# Patient Record
Sex: Female | Born: 1990 | Race: White | Hispanic: No | Marital: Married | Smoking: Never smoker
Health system: Southern US, Community
[De-identification: ages and names within clinical notes are randomized; demographics above are authoritative.]

## PROBLEM LIST (undated history)

## (undated) DIAGNOSIS — R519 Headache, unspecified: Secondary | ICD-10-CM

## (undated) DIAGNOSIS — N83209 Unspecified ovarian cyst, unspecified side: Secondary | ICD-10-CM

## (undated) DIAGNOSIS — A609 Anogenital herpesviral infection, unspecified: Secondary | ICD-10-CM

## (undated) DIAGNOSIS — K219 Gastro-esophageal reflux disease without esophagitis: Secondary | ICD-10-CM

## (undated) DIAGNOSIS — T7840XA Allergy, unspecified, initial encounter: Secondary | ICD-10-CM

## (undated) HISTORY — DX: Anogenital herpesviral infection, unspecified: A60.9

## (undated) HISTORY — DX: Unspecified ovarian cyst, unspecified side: N83.209

## (undated) HISTORY — DX: Allergy, unspecified, initial encounter: T78.40XA

## (undated) HISTORY — PX: WISDOM TOOTH EXTRACTION: SHX21

---

## 2012-02-07 ENCOUNTER — Ambulatory Visit (INDEPENDENT_AMBULATORY_CARE_PROVIDER_SITE_OTHER): Payer: Managed Care, Other (non HMO) | Admitting: Internal Medicine

## 2012-02-07 VITALS — BP 92/52 | HR 86 | Temp 98.6°F | Resp 14 | Ht 65.0 in | Wt 112.6 lb

## 2012-02-07 DIAGNOSIS — L738 Other specified follicular disorders: Secondary | ICD-10-CM

## 2012-02-07 DIAGNOSIS — L739 Follicular disorder, unspecified: Secondary | ICD-10-CM

## 2012-02-07 MED ORDER — CEPHALEXIN 250 MG PO CAPS: 250.0000 mg | ORAL_CAPSULE | Freq: Three times a day (TID) | ORAL | Status: AC

## 2012-02-07 NOTE — Progress Notes (Signed)
  Subjective:    Patient ID: Sandra Wilcox, female    DOB: [birth year], 21 y.o.   MRN: [id number]  HPI Mass? L axillae 1 day tender   Healthy no meds  [facility] studies   Review of Systems     Objective:   Physical Exam inflammed follicle L axillae       Assessment & Plan:  Problem #1 folliculitis Keflex 250 3 times a day #15 Hot soaks 3 times a day

## 2012-10-11 ENCOUNTER — Ambulatory Visit: Payer: 59 | Admitting: Emergency Medicine

## 2012-10-11 VITALS — BP 124/76 | HR 97 | Temp 98.1°F | Resp 16 | Ht 65.0 in | Wt 121.4 lb

## 2012-10-11 DIAGNOSIS — J309 Allergic rhinitis, unspecified: Secondary | ICD-10-CM

## 2012-10-11 MED ORDER — FLUTICASONE PROPIONATE 50 MCG/ACT NA SUSP: 2.0000 | Freq: Every day | NASAL | Status: DC

## 2012-10-11 NOTE — Progress Notes (Signed)
  Subjective:    Patient ID: Sandra Wilcox, female    DOB: 1990/10/25, 22 y.o.   MRN: [id number]  HPI  Patient comes into our office today with sinus problems for 2 weeks.  She also states that she was at the pool two days ago and her left ears feels clogged up    Review of Systems  HENT: Positive for sneezing and sinus pressure.   Eyes: Positive for discharge and itching.  Respiratory: Positive for cough.   Neurological: Positive for headaches.       Objective:   Physical Exam there is wax occluding the left external auditory canal appear the right external auditory canal is normal the nose is congested throat is normal neck is supple chest is clear.        Assessment & Plan:    Patient is allergic rhinitis. We'll treat with Zyrtec. Eyedrops, and Flonase.

## 2012-10-11 NOTE — Progress Notes (Deleted)
  Subjective:    Patient ID: Sandra Wilcox, female    DOB: 10-Jun-1990, 22 y.o.   MRN: [id number]  HPI    Review of Systems     Objective:   Physical Exam        Assessment & Plan:

## 2012-10-11 NOTE — Patient Instructions (Signed)
      Allergic Rhinitis Allergic rhinitis is when the mucous membranes in the nose respond to allergens. Allergens are particles in the air that cause your body to have an allergic reaction. This causes you to release allergic antibodies. Through a chain of events, these eventually cause you to release histamine into the blood stream (hence the use of antihistamines). Although meant to be protective to the body, it is this release that causes your discomfort, such as frequent sneezing, congestion and an itchy runny nose.  CAUSES  The pollen allergens may come from grasses, trees, and weeds. This is seasonal allergic rhinitis, or "hay fever." Other allergens cause year-round allergic rhinitis (perennial allergic rhinitis) such as house dust mite allergen, pet dander and mold spores.  SYMPTOMS   Nasal stuffiness (congestion).  Runny, itchy nose with sneezing and tearing of the eyes.  There is often an itching of the mouth, eyes and ears. It cannot be cured, but it can be controlled with medications. DIAGNOSIS  If you are unable to determine the offending allergen, skin or blood testing may find it. TREATMENT   Avoid the allergen.  Medications and allergy shots (immunotherapy) can help.  Hay fever may often be treated with antihistamines in pill or nasal spray forms. Antihistamines block the effects of histamine. There are over-the-counter medicines that may help with nasal congestion and swelling around the eyes. Check with your caregiver before taking or giving this medicine. If the treatment above does not work, there are many new medications your caregiver can prescribe. Stronger medications may be used if initial measures are ineffective. Desensitizing injections can be used if medications and avoidance fails. Desensitization is when a patient is given ongoing shots until the body becomes less sensitive to the allergen. Make sure you follow up with your caregiver if problems  continue. SEEK MEDICAL CARE IF:   You develop fever (more than 100.5 F (38.1 C).  You develop a cough that does not stop easily (persistent).  You have shortness of breath.  You start wheezing.  Symptoms interfere with normal daily activities. Document Released: 02/15/2001 Document Revised: 08/15/2011 Document Reviewed: 08/27/2008 [facility] Patient Information 2013 [place], [place]. Please take Zyrtec 10 mg one at night. He uses Naphcon-A eyedrops and Flonase you were given

## 2012-11-28 ENCOUNTER — Ambulatory Visit: Payer: 59 | Admitting: Physician Assistant

## 2012-11-28 VITALS — BP 111/70 | HR 76 | Temp 98.3°F | Resp 16 | Ht 65.0 in | Wt 120.6 lb

## 2012-11-28 DIAGNOSIS — H6692 Otitis media, unspecified, left ear: Secondary | ICD-10-CM

## 2012-11-28 DIAGNOSIS — R05 Cough: Secondary | ICD-10-CM

## 2012-11-28 DIAGNOSIS — H6690 Otitis media, unspecified, unspecified ear: Secondary | ICD-10-CM

## 2012-11-28 DIAGNOSIS — R059 Cough, unspecified: Secondary | ICD-10-CM

## 2012-11-28 MED ORDER — HYDROCODONE-HOMATROPINE 5-1.5 MG/5ML PO SYRP: 5.0000 mL | ORAL_SOLUTION | Freq: Three times a day (TID) | ORAL | Status: DC | PRN

## 2012-11-28 MED ORDER — AMOXICILLIN 500 MG PO CAPS: 1000.0000 mg | ORAL_CAPSULE | Freq: Three times a day (TID) | ORAL | Status: DC

## 2012-11-28 NOTE — Progress Notes (Signed)
  Subjective:    Patient ID: Sandra Wilcox, female    DOB: Oct 08, 1990, 22 y.o.   MRN: [id number]  HPI 22 year old female presents with 5 day history of cough, PND, and sore throat.  Symptoms started 5 days ago and have progressively worsened.  Has had intermittent chills but no documented fever or body aches. Does complain that since yesterday her left ear has become painful.  Denies nausea, vomiting, dizziness, wheezing, SOB, or hemoptysis.      Review of Systems  Constitutional: Positive for chills. Negative for fever.  HENT: Positive for ear pain (left), congestion, sore throat, rhinorrhea and postnasal drip.   Respiratory: Positive for cough. Negative for shortness of breath and wheezing.   Gastrointestinal: Negative for nausea and vomiting.  Neurological: Negative for dizziness and headaches.       Objective:   Physical Exam  Constitutional: She is oriented to person, place, and time. She appears well-developed and well-nourished.  HENT:  Head: Normocephalic and atraumatic.  Right Ear: Hearing, tympanic membrane, external ear and ear canal normal.  Left Ear: Hearing, external ear and ear canal normal. Tympanic membrane is erythematous. Tympanic membrane is not perforated. A middle ear effusion is present.  Nose: Right sinus exhibits no maxillary sinus tenderness and no frontal sinus tenderness. Left sinus exhibits no maxillary sinus tenderness and no frontal sinus tenderness.  Mouth/Throat: Uvula is midline, oropharynx is clear and moist and mucous membranes are normal. No oropharyngeal exudate.  Eyes: Conjunctivae are normal.  Neck: Normal range of motion. Neck supple.  Cardiovascular: Normal rate, regular rhythm and normal heart sounds.   Pulmonary/Chest: Effort normal and breath sounds normal.  Lymphadenopathy:    She has no cervical adenopathy.  Neurological: She is alert and oriented to person, place, and time.  Psychiatric: She has a normal mood and affect. Her behavior is  normal. Judgment and thought content normal.          Assessment & Plan:  Otitis media, left - Plan: amoxicillin (AMOXIL) 500 MG capsule  Cough - Plan: HYDROcodone-homatropine (HYCODAN) 5-1.5 MG/5ML syrup  Start amoxicillin 1000 mg tid x 10 days Hycodan qhs prn cough - caution sedation Increase fluids and rest.  Follow up if symptoms worsen or fail to improve.

## 2014-02-03 ENCOUNTER — Emergency Department (INDEPENDENT_AMBULATORY_CARE_PROVIDER_SITE_OTHER)
Admission: EM | Admit: 2014-02-03 | Discharge: 2014-02-03 | Disposition: A | Discharge status: Home / Self Care | Payer: 59 | Source: Home / Self Care

## 2014-02-03 ENCOUNTER — Encounter (HOSPITAL_COMMUNITY): Payer: Self-pay | Admitting: Emergency Medicine

## 2014-02-03 DIAGNOSIS — R05 Cough: Secondary | ICD-10-CM

## 2014-02-03 DIAGNOSIS — R059 Cough, unspecified: Secondary | ICD-10-CM

## 2014-02-03 DIAGNOSIS — M545 Low back pain, unspecified: Secondary | ICD-10-CM

## 2014-02-03 DIAGNOSIS — B373 Candidiasis of vulva and vagina: Secondary | ICD-10-CM

## 2014-02-03 DIAGNOSIS — Z9109 Other allergy status, other than to drugs and biological substances: Secondary | ICD-10-CM

## 2014-02-03 DIAGNOSIS — B3731 Acute candidiasis of vulva and vagina: Secondary | ICD-10-CM

## 2014-02-03 LAB — POCT PREGNANCY, URINE: Preg Test, Ur: NEGATIVE

## 2014-02-03 NOTE — ED Notes (Signed)
C/o L lower back pain onset this AM.  Has been on antibiotics Augmentin for a sinus infection for 1 week and it gave her a yeast infection.  Still have a cough- has had for 1 month.

## 2014-02-03 NOTE — Discharge Instructions (Signed)
Allergies  Allergies may happen from anything your body is sensitive to. This may be food, medicines, pollens, chemicals, and many other things. Food allergies can be severe and deadly.  HOME CARE  If you do not know what causes a reaction, keep a diary. Write down the foods you ate and the symptoms that followed. Avoid foods that cause reactions.  If you have red raised spots (hives) or a rash:  Take medicine as told by your doctor.  Use medicines for red raised spots and itching as needed.  Apply cold cloths (compresses) to the skin. Take a cool bath. Avoid hot baths or showers.  If you are severely allergic:  It is often necessary to go to the hospital after you have treated your reaction.  Wear your medical alert jewelry.  You and your family must learn how to give a allergy shot or use an allergy kit (anaphylaxis kit).  Always carry your allergy kit or shot with you. Use this medicine as told by your doctor if a severe reaction is occurring. GET HELP RIGHT AWAY IF:  You have trouble breathing or are making high-pitched whistling sounds (wheezing).  You have a tight feeling in your chest or throat.  You have a puffy (swollen) mouth.  You have red raised spots, puffiness (swelling), or itching all over your body.  You have had a severe reaction that was helped by your allergy kit or shot. The reaction can return once the medicine has worn off.  You think you are having a food allergy. Symptoms most often happen within 30 minutes of eating a food.  Your symptoms have not gone away within 2 days or are getting worse.  You have new symptoms.  You want to retest yourself with a food or drink you think causes an allergic reaction. Only do this under the care of a doctor. MAKE SURE YOU:   Understand these instructions.  Will watch your condition.  Will get help right away if you are not doing well or get worse. Document Released: 09/17/2012 Document Reviewed:  09/17/2012 [facility] Patient Information 2015 [place]. This information is not intended to replace advice given to you by your health care provider. Make sure you discuss any questions you have with your health care provider.  Back Pain, Adult Back pain is very common. The pain often gets better over time. The cause of back pain is usually not dangerous. Most people can learn to manage their back pain on their own.  HOME CARE   Stay active. Start with short walks on flat ground if you can. Try to walk farther each day.  Do not sit, drive, or stand in one place for more than 30 minutes. Do not stay in bed.  Do not avoid exercise or work. Activity can help your back heal faster.  Be careful when you bend or lift an object. Bend at your knees, keep the object close to you, and do not twist.  Sleep on a firm mattress. Lie on your side, and bend your knees. If you lie on your back, put a pillow under your knees.  Only take medicines as told by your doctor.  Put ice on the injured area.  Put ice in a plastic bag.  Place a towel between your skin and the bag.  Leave the ice on for 15-20 minutes, 03-04 times a day for the first 2 to 3 days. After that, you can switch between ice and heat packs.  Ask your  doctor about back exercises or massage.  Avoid feeling anxious or stressed. Find good ways to deal with stress, such as exercise. GET HELP RIGHT AWAY IF:   Your pain does not go away with rest or medicine.  Your pain does not go away in 1 week.  You have new problems.  You do not feel well.  The pain spreads into your legs.  You cannot control when you poop (bowel movement) or pee (urinate).  Your arms or legs feel weak or lose feeling (numbness).  You feel sick to your stomach (nauseous) or throw up (vomit).  You have belly (abdominal) pain.  You feel like you may pass out (faint). MAKE SURE YOU:   Understand these instructions.  Will watch your  condition.  Will get help right away if you are not doing well or get worse. Document Released: 11/09/2007 Document Revised: 08/15/2011 Document Reviewed: 09/24/2013 [facility] Patient Information 2015 [place], [place]. This information is not intended to replace advice given to you by your health care provider. Make sure you discuss any questions you have with your health care provider.  Candida Infection A Candida infection (also called yeast, fungus, and Monilia infection) is an overgrowth of yeast that can occur anywhere on the body. A yeast infection commonly occurs in warm, moist body areas. Usually, the infection remains localized but can spread to become a systemic infection. A yeast infection may be a sign of a more severe disease such as diabetes, leukemia, or AIDS. A yeast infection can occur in both men and women. In women, Candida vaginitis is a vaginal infection. It is one of the most common causes of vaginitis. Men usually do not have symptoms or know they have an infection until other problems develop. Men may find out they have a yeast infection because their sex partner has a yeast infection. Uncircumcised men are more likely to get a yeast infection than circumcised men. This is because the uncircumcised glans is not exposed to air and does not remain as dry as that of a circumcised glans. Older adults may develop yeast infections around dentures. CAUSES  Women  Antibiotics.  Steroid medication taken for a long time.  Being overweight (obese).  Diabetes.  Poor immune condition.  Certain serious medical conditions.  Immune suppressive medications for organ transplant patients.  Chemotherapy.  Pregnancy.  Menstruation.  Stress and fatigue.  Intravenous drug use.  Oral contraceptives.  Wearing tight-fitting clothes in the crotch area.  Catching it from a sex partner who has a yeast infection.  Spermicide.  Intravenous, urinary, or other  catheters. Men  Catching it from a sex partner who has a yeast infection.  Having oral or anal sex with a person who has the infection.  Spermicide.  Diabetes.  Antibiotics.  Poor immune system.  Medications that suppress the immune system.  Intravenous drug use.  Intravenous, urinary, or other catheters. SYMPTOMS  Women  Thick, white vaginal discharge.  Vaginal itching.  Redness and swelling in and around the vagina.  Irritation of the lips of the vagina and perineum.  Blisters on the vaginal lips and perineum.  Painful sexual intercourse.  Low blood sugar (hypoglycemia).  Painful urination.  Bladder infections.  Intestinal problems such as constipation, indigestion, bad breath, bloating, increase in gas, diarrhea, or loose stools. Men  Men may develop intestinal problems such as constipation, indigestion, bad breath, bloating, increase in gas, diarrhea, or loose stools.  Dry, cracked skin on the penis with itching or discomfort.  Jock  itch.  Dry, flaky skin.  Athlete's foot.  Hypoglycemia. DIAGNOSIS  Women  A history and an exam are performed.  The discharge may be examined under a microscope.  A culture may be taken of the discharge. Men  A history and an exam are performed.  Any discharge from the penis or areas of cracked skin will be looked at under the microscope and cultured.  Stool samples may be cultured. TREATMENT  Women  Vaginal antifungal suppositories and creams.  Medicated creams to decrease irritation and itching on the outside of the vagina.  Warm compresses to the perineal area to decrease swelling and discomfort.  Oral antifungal medications.  Medicated vaginal suppositories or cream for repeated or recurrent infections.  Wash and dry the irritation areas before applying the cream.  Eating yogurt with Lactobacillus may help with prevention and treatment.  Sometimes painting the vagina with gentian violet  solution may help if creams and suppositories do not work. Men  Antifungal creams and oral antifungal medications.  Sometimes treatment must continue for 30 days after the symptoms go away to prevent recurrence. HOME CARE INSTRUCTIONS  Women  Use cotton underwear and avoid tight-fitting clothing.  Avoid colored, scented toilet paper and deodorant tampons or pads.  Do not douche.  Keep your diabetes under control.  Finish all the prescribed medications.  Keep your skin clean and dry.  Consume milk or yogurt with Lactobacillus-active culture regularly. If you get frequent yeast infections and think that is what the infection is, there are over-the-counter medications that you can get. If the infection does not show healing in 3 days, talk to your caregiver.  Tell your sex partner you have a yeast infection. Your partner may need treatment also, especially if your infection does not clear up or recurs. Men  Keep your skin clean and dry.  Keep your diabetes under control.  Finish all prescribed medications.  Tell your sex partner that you have a yeast infection so he or she can be treated if necessary. SEEK MEDICAL CARE IF:   Your symptoms do not clear up or worsen in one week after treatment.  You have an oral temperature above 102 F (38.9 C).  You have trouble swallowing or eating for a prolonged time.  You develop blisters on and around your vagina.  You develop vaginal bleeding and it is not your menstrual period.  You develop abdominal pain.  You develop intestinal problems as mentioned above.  You get weak or light-headed.  You have painful or increased urination.  You have pain during sexual intercourse. MAKE SURE YOU:   Understand these instructions.  Will watch your condition.  Will get help right away if you are not doing well or get worse. Document Released: 06/30/2004 Document Revised: 10/07/2013 Document Reviewed: 10/12/2009 [facility] Patient  Information 2015 [place], [place]. This information is not intended to replace advice given to you by your health care provider. Make sure you discuss any questions you have with your health care provider.   Yeast infection following an antibiotic can happen-Ok to complete course of Monistat that you had over the counter.  Low Back sprain-Aleve 2 in am and 2 in pm for 5-7 days.  Cough secondary to seasonal allergies-Start daily Zyrtec or Claritin with use of Mucinex as needed for cough. No signs of infection

## 2014-02-03 NOTE — ED Provider Notes (Signed)
CSN: [id number]     Arrival date & time 02/03/14  1918 History   None    Chief Complaint  Patient presents with  . Back Pain   (Consider location/radiation/quality/duration/timing/severity/associated sxs/prior Treatment) HPI Comments: Patient presents with several co's. She completed a course of Augmentin 2 days ago and now has itching, white discharge following antibiotic use. This has happened in the past. No urinary burning, hematuria or abdominal pain.  Her cough has continued despite Augmentin. No fever or chills. No malaise. She notes post nasal discharge and nasal congestion. Cough has caused pain at times in her back, and today onset of right lower back pain. Pain with ROM and with coughing. No radicular pain. No weakness.   Patient is a 23 y.o. female presenting with back pain. The history is provided by the patient.  Back Pain   Past Medical History  Diagnosis Date  . Allergy    History reviewed. No pertinent past surgical history. Family History  Problem Relation Age of Onset  . Cancer Mother     skin- melanoma  . Cancer Maternal Grandfather     skin   History  Substance Use Topics  . Smoking status: Never Smoker   . Smokeless tobacco: Not on file  . Alcohol Use: 3.6 oz/week    6 Shots of liquor per week   OB History   Grav Para Term Preterm Abortions TAB SAB Ect Mult Living                 Review of Systems  Musculoskeletal: Positive for back pain.  All other systems reviewed and are negative.   Allergies  Review of patient's allergies indicates no known allergies.  Home Medications   Prior to Admission medications   Medication Sig Start Date End Date Taking? Authorizing Provider  amoxicillin-clavulanate (AUGMENTIN) 875-125 MG per tablet Take 1 tablet by mouth 2 (two) times daily.   Yes Historical Provider, MD  Cranberry-Vitamin C-Probiotic (AZO CRANBERRY PO) Take 1 tablet by mouth 3 (three) times daily.   Yes Historical Provider, MD  Homeopathic  Products (AZO YEAST PLUS) TABS Take 1 tablet by mouth.   Yes Historical Provider, MD  amoxicillin (AMOXIL) 500 MG capsule Take 2 capsules (1,000 mg total) by mouth 3 (three) times daily. 11/28/12   Heather Jaquita Rector, PA-C  fluticasone (FLONASE) 50 MCG/ACT nasal spray Place 2 sprays into the nose daily. 10/11/12   Collene Gobble, MD  HYDROcodone-homatropine [facility]) 5-1.5 MG/5ML syrup Take 5 mLs by mouth every 8 (eight) hours as needed for cough. 11/28/12   Heather M Marte, PA-C   BP 120/83  Pulse 70  Temp(Src) 98.6 F (37 C) (Oral)  Resp 14  SpO2 100%  LMP 01/16/2014 Physical Exam  Nursing note and vitals reviewed. Constitutional: She is oriented to person, place, and time. She appears well-developed and well-nourished. No distress.  Neck: Normal range of motion. Neck supple.  Cardiovascular: Normal rate and regular rhythm.   Pulmonary/Chest: Effort normal and breath sounds normal. No respiratory distress. She has no wheezes. She has no rales.  Abdominal: Soft.  Musculoskeletal: She exhibits tenderness. She exhibits no edema.  Right lower back spasm, with pain to palpation and with extension.   Neurological: She is alert and oriented to person, place, and time.  Skin: Skin is warm and dry. She is not diaphoretic.  Psychiatric: Her behavior is normal.    ED Course  Procedures (including critical care time) Labs Review Labs Reviewed  URINALYSIS, DIPSTICK ONLY  POCT PREGNANCY, URINE    Imaging Review No results found.   MDM   1. Vaginal candida   2. Left-sided low back pain without sciatica   3. Environmental allergies   4. Cough    1 May use Monistat as she bought over the counter 2. NSAID therapy with Aleve 2 bid and rest 3. Start Zyrtec or Claritin daily and interim Mucinex as needed    Riki Sheer, PA-C 02/03/14 2007

## 2014-02-04 LAB — POCT URINALYSIS DIP (DEVICE)
Bilirubin Urine: NEGATIVE
Glucose, UA: NEGATIVE mg/dL
Hgb urine dipstick: NEGATIVE
Ketones, ur: NEGATIVE mg/dL
LEUKOCYTES UA: NEGATIVE
NITRITE: NEGATIVE
PROTEIN: NEGATIVE mg/dL
Specific Gravity, Urine: 1.01 (ref 1.005–1.030)
Urobilinogen, UA: 0.2 mg/dL (ref 0.0–1.0)
pH: 7 (ref 5.0–8.0)

## 2014-02-04 NOTE — ED Provider Notes (Signed)
Medical screening examination/treatment/procedure(s) were performed by resident physician or non-physician practitioner and as supervising physician I was immediately available for consultation/collaboration.   Marycruz Boehner DOUGLAS MD.   Shamiya Demeritt D Dairon Procter, MD 02/04/14 1230 

## 2014-03-11 ENCOUNTER — Emergency Department (INDEPENDENT_AMBULATORY_CARE_PROVIDER_SITE_OTHER)
Admission: EM | Admit: 2014-03-11 | Discharge: 2014-03-11 | Disposition: A | Discharge status: Home / Self Care | Payer: 59 | Source: Home / Self Care

## 2014-03-11 ENCOUNTER — Encounter (HOSPITAL_COMMUNITY): Payer: Self-pay | Admitting: Emergency Medicine

## 2014-03-11 DIAGNOSIS — B029 Zoster without complications: Secondary | ICD-10-CM

## 2014-03-11 MED ORDER — VALACYCLOVIR HCL 1 G PO TABS: 1000.0000 mg | ORAL_TABLET | Freq: Three times a day (TID) | ORAL | Status: DC

## 2014-03-11 MED ORDER — LIDOCAINE 5 % EX PTCH: 1.0000 | MEDICATED_PATCH | CUTANEOUS | Status: DC

## 2014-03-11 MED ORDER — ACYCLOVIR 5 % EX CREA: 1.0000 "application " | TOPICAL_CREAM | CUTANEOUS | Status: DC

## 2014-03-11 NOTE — ED Notes (Signed)
Rash for 2 days, evaluated by Hayden Rasmussendavid mabe, np

## 2014-03-11 NOTE — ED Provider Notes (Signed)
CSN: [id number]     Arrival date & time 03/11/14  1543 History   First MD Initiated Contact with Patient 03/11/14 1618     Chief Complaint  Patient presents with  . Rash   (Consider location/radiation/quality/duration/timing/severity/associated sxs/prior Treatment) HPI Comments: Yesterday developed a stingy, tender rash to the right posterior thigh. Has persisted today and developing pain in the skin around the thigh, although the rash is limited in size. Hx of varicella.   Past Medical History  Diagnosis Date  . Allergy    History reviewed. No pertinent past surgical history. Family History  Problem Relation Age of Onset  . Cancer Mother     skin- melanoma  . Cancer Maternal Grandfather     skin   History  Substance Use Topics  . Smoking status: Never Smoker   . Smokeless tobacco: Not on file  . Alcohol Use: 3.6 oz/week    6 Shots of liquor per week   OB History   Grav Para Term Preterm Abortions TAB SAB Ect Mult Living                 Review of Systems  Skin: Positive for rash.  All other systems reviewed and are negative.   Allergies  Review of patient's allergies indicates no known allergies.  Home Medications   Prior to Admission medications   Medication Sig Start Date End Date Taking? Authorizing Provider  acyclovir cream (ZOVIRAX) 5 % Apply 1 application topically every 3 (three) hours. 03/11/14   Hayden Rasmussenavid Lael Wetherbee, NP  valACYclovir (VALTREX) 1000 MG tablet Take 1 tablet (1,000 mg total) by mouth 3 (three) times daily. 03/11/14   Hayden Rasmussenavid Aidah Forquer, NP   BP 109/66  Pulse 79  Temp(Src) 99.2 F (37.3 C) (Oral)  Resp 16  Ht 5\' 4"  (1.626 m)  Wt 130 lb (58.968 kg)  BMI 22.30 kg/m2  SpO2 100%  LMP 02/19/2014 Physical Exam  Nursing note and vitals reviewed. Constitutional: She is oriented to person, place, and time. She appears well-developed and well-nourished. No distress.  Neck: Normal range of motion. Neck supple.  Pulmonary/Chest: Effort normal. No respiratory  distress.  Neurological: She is alert and oriented to person, place, and time.  Skin: Skin is warm and dry.  Papulovesicular rash over a red base to the R posterior thigh in dermatome S1. Tender. No lymphangitis, no drainage.  Psychiatric: She has a normal mood and affect.    ED Course  Procedures (including critical care time) Labs Review Labs Reviewed - No data to display  Imaging Review No results found.   MDM   1. Shingles rash     Valtrex 1000 tid Acyclovir cream lidoderm if needed for pain. Pt st does not need analgesics.   Hayden Rasmussenavid Icesis Renn, NP 03/11/14 [phone]

## 2014-03-11 NOTE — Discharge Instructions (Signed)

## 2014-03-13 NOTE — ED Provider Notes (Signed)
Medical screening examination/treatment/procedure(s) were performed by resident physician or non-physician practitioner and as supervising physician I was immediately available for consultation/collaboration.   Matylda Fehring DOUGLAS MD.   Chrishaun Sasso D Maziah Smola, MD 03/13/14 1740 

## 2014-03-18 ENCOUNTER — Emergency Department (INDEPENDENT_AMBULATORY_CARE_PROVIDER_SITE_OTHER)
Admission: EM | Admit: 2014-03-18 | Discharge: 2014-03-18 | Disposition: A | Discharge status: Home / Self Care | Payer: 59 | Source: Home / Self Care | Attending: Family Medicine | Admitting: Family Medicine

## 2014-03-18 DIAGNOSIS — L739 Follicular disorder, unspecified: Secondary | ICD-10-CM

## 2014-03-18 MED ORDER — DOXYCYCLINE HYCLATE 100 MG PO CAPS: 100.0000 mg | ORAL_CAPSULE | Freq: Two times a day (BID) | ORAL | Status: DC

## 2014-03-18 NOTE — ED Provider Notes (Signed)
Sandra Wilcox is a 23 y.o. female who presents to Urgent Care today for rash. Patient was seen on October 6 with a rash over posterior right thigh. She was tentatively diagnosed with shingles and treated with oral valacyclovir and topical acyclovir. She went for a second opinion and was told that she has a spider bite and should stop all medications. She notes that she's been slowly feeling better however today he noted 3 tiny papules at the area of the original rash and some itching. She denies any pain fevers or chills nausea vomiting or diarrhea.   Past Medical History  Diagnosis Date  . Allergy    History  Substance Use Topics  . Smoking status: Never Smoker   . Smokeless tobacco: Not on file  . Alcohol Use: 3.6 oz/week    6 Shots of liquor per week   ROS as above Medications: No current facility-administered medications for this encounter.   Current Outpatient Prescriptions  Medication Sig Dispense Refill  . doxycycline (VIBRAMYCIN) 100 MG capsule Take 1 capsule (100 mg total) by mouth 2 (two) times daily.  12 capsule  0  . [DISCONTINUED] fluticasone (FLONASE) 50 MCG/ACT nasal spray Place 2 sprays into the nose daily.  16 g  6    Exam:  BP 112/76  Pulse 87  Temp(Src) 98.7 F (37.1 C) (Oral)  Resp 16  SpO2 100%  LMP 02/19/2014 Gen: Well NAD Skin: Right posterior thigh. Scale macular lesion with 3 tiny mildly erythematous nontender papules.  No results found for this or any previous visit (from the past 24 hour(s)). No results found.  Assessment and Plan: 23 y.o. female with unclear etiology of rash. Likely folliculitis at this point. Treat with doxycycline as needed. Discuss contraception while taking antibiotics. Discussed warning signs or symptoms. Please see discharge instructions. Patient expresses understanding.     Rodolph BongEvan S Corey, MD 03/18/14 1537

## 2014-03-18 NOTE — Discharge Instructions (Signed)
Thank you for coming in today. Take doxycycline for skin infection.  Use birth control while taking this medicine.  Use warm compress.  Come back as needed.    Folliculitis  Folliculitis is redness, soreness, and swelling (inflammation) of the hair follicles. This condition can occur anywhere on the body. People with weakened immune systems, diabetes, or obesity have a greater risk of getting folliculitis. CAUSES  Bacterial infection. This is the most common cause.  Fungal infection.  Viral infection.  Contact with certain chemicals, especially oils and tars. Long-term folliculitis can result from bacteria that live in the nostrils. The bacteria may trigger multiple outbreaks of folliculitis over time. SYMPTOMS Folliculitis most commonly occurs on the scalp, thighs, legs, back, buttocks, and areas where hair is shaved frequently. An early sign of folliculitis is a small, white or yellow, pus-filled, itchy lesion (pustule). These lesions appear on a red, inflamed follicle. They are usually less than 0.2 inches (5 mm) wide. When there is an infection of the follicle that goes deeper, it becomes a boil or furuncle. A group of closely packed boils creates a larger lesion (carbuncle). Carbuncles tend to occur in hairy, sweaty areas of the body. DIAGNOSIS  Your caregiver can usually tell what is wrong by doing a physical exam. A sample may be taken from one of the lesions and tested in a lab. This can help determine what is causing your folliculitis. TREATMENT  Treatment may include:  Applying warm compresses to the affected areas.  Taking antibiotic medicines orally or applying them to the skin.  Draining the lesions if they contain a large amount of pus or fluid.  Laser hair removal for cases of long-lasting folliculitis. This helps to prevent regrowth of the hair. HOME CARE INSTRUCTIONS  Apply warm compresses to the affected areas as directed by your caregiver.  If antibiotics are  prescribed, take them as directed. Finish them even if you start to feel better.  You may take over-the-counter medicines to relieve itching.  Do not shave irritated skin.  Follow up with your caregiver as directed. SEEK IMMEDIATE MEDICAL CARE IF:   You have increasing redness, swelling, or pain in the affected area.  You have a fever. MAKE SURE YOU:  Understand these instructions.  Will watch your condition.  Will get help right away if you are not doing well or get worse. Document Released: 08/01/2001 Document Revised: 11/22/2011 Document Reviewed: 08/23/2011 [facility] Patient Information 2015 [place], [place]. This information is not intended to replace advice given to you by your health care provider. Make sure you discuss any questions you have with your health care provider.

## 2014-05-08 ENCOUNTER — Ambulatory Visit (INDEPENDENT_AMBULATORY_CARE_PROVIDER_SITE_OTHER): Payer: 59 | Admitting: Physician Assistant

## 2014-05-08 VITALS — BP 108/64 | HR 92 | Temp 98.4°F | Resp 18 | Ht 65.75 in | Wt 122.6 lb

## 2014-05-08 DIAGNOSIS — R198 Other specified symptoms and signs involving the digestive system and abdomen: Secondary | ICD-10-CM

## 2014-05-08 DIAGNOSIS — R300 Dysuria: Secondary | ICD-10-CM

## 2014-05-08 DIAGNOSIS — R350 Frequency of micturition: Secondary | ICD-10-CM

## 2014-05-08 DIAGNOSIS — R3989 Other symptoms and signs involving the genitourinary system: Secondary | ICD-10-CM

## 2014-05-08 LAB — POCT URINALYSIS DIPSTICK
Bilirubin, UA: NEGATIVE
Blood, UA: NEGATIVE
GLUCOSE UA: NEGATIVE
Ketones, UA: NEGATIVE
NITRITE UA: NEGATIVE
Protein, UA: NEGATIVE
Spec Grav, UA: 1.015
Urobilinogen, UA: 0.2
pH, UA: 6.5

## 2014-05-08 LAB — POCT UA - MICROSCOPIC ONLY
CASTS, UR, LPF, POC: NEGATIVE
Crystals, Ur, HPF, POC: NEGATIVE
Mucus, UA: NEGATIVE
Yeast, UA: NEGATIVE

## 2014-05-08 MED ORDER — ACYCLOVIR 200 MG PO CAPS: 200.0000 mg | ORAL_CAPSULE | Freq: Every day | ORAL | Status: DC

## 2014-05-08 NOTE — Patient Instructions (Addendum)
I will call you with your results  Genital Herpes Genital herpes is a sexually transmitted disease. This means that it is a disease passed by having sex with an infected person. There is no cure for genital herpes. The time between attacks can be months to years. The virus may live in a person but produce no problems (symptoms). This infection can be passed to a baby as it travels down the birth canal (vagina). In a newborn, this can cause central nervous system damage, eye damage, or even death. The virus that causes genital herpes is usually HSV-2 virus. The virus that causes oral herpes is usually HSV-1. The diagnosis (learning what is wrong) is made through culture results. SYMPTOMS  Usually symptoms of pain and itching begin a few days to a week after contact. It first appears as small blisters that progress to small painful ulcers which then scab over and heal after several days. It affects the outer genitalia, birth canal, cervix, penis, anal area, buttocks, and thighs. HOME CARE INSTRUCTIONS   Keep ulcerated areas dry and clean.  Take medications as directed. Antiviral medications can speed up healing. They will not prevent recurrences or cure this infection. These medications can also be taken for suppression if there are frequent recurrences.  While the infection is active, it is contagious. Avoid all sexual contact during active infections.  Condoms may help prevent spread of the herpes virus.  Practice safe sex.  Wash your hands thoroughly after touching the genital area.  Avoid touching your eyes after touching your genital area.  Inform your caregiver if you have had genital herpes and become pregnant. It is your responsibility to insure a safe outcome for your baby in this pregnancy.  Only take over-the-counter or prescription medicines for pain, discomfort, or fever as directed by your caregiver. SEEK MEDICAL CARE IF:   You have a recurrence of this infection.  You do not  respond to medications and are not improving.  You have new sources of pain or discharge which have changed from the original infection.  You have an oral temperature above 102 F (38.9 C).  You develop abdominal pain.  You develop eye pain or signs of eye infection. Document Released: 05/20/2000 Document Revised: 08/15/2011 Document Reviewed: 06/10/2009 [facility] PartnershipExitCare Patient Information 2015 [place], [place]. This information is not intended to replace advice given to you by your health care provider. Make sure you discuss any questions you have with your health care provider.

## 2014-05-08 NOTE — Progress Notes (Signed)
I was directly involved with the patient's care and agree with the physical, diagnosis and treatment plan.  

## 2014-05-08 NOTE — Progress Notes (Signed)
   Subjective:    Patient ID: Sandra Wilcox, female    DOB: 06-05-1991, 23 y.o.   MRN: [id number]  HPI  This is a 23 year old female presenting with dysuria x 3 days. She states 1 day ago she realized there was an area of redness on her labia that was painful. She feels she is having burning when the urine touches this area. She is sexually active with one partner for the past 9 months. Her LMP was 04/25/14 and normal for her. She is on nexplanon implant. She denies abdominal pain, urinary frequency, hematuria, N/V, vaginal discharge, back pain, fever or chills. She has never had any STIs.   LMP nov 20th.   Review of Systems  Constitutional: Negative for fever and chills.  Gastrointestinal: Negative for nausea, vomiting, abdominal pain and diarrhea.  Genitourinary: Positive for dysuria and genital sores. Negative for hematuria, vaginal discharge, menstrual problem and pelvic pain.  Musculoskeletal: Negative for back pain.  Skin: Negative for rash.   Patient Active Problem List   Diagnosis Date Noted  . Allergic rhinitis 10/11/2012   Prior to Admission medications   Not on File   No Known Allergies  Patient's social and family history were reviewed.     Objective:   Physical Exam  Constitutional: She is oriented to person, place, and time. She appears well-developed and well-nourished. No distress.  HENT:  Head: Normocephalic and atraumatic.  Right Ear: Hearing normal.  Left Ear: Hearing normal.  Nose: Nose normal.  Eyes: Conjunctivae and lids are normal. Right eye exhibits no discharge. Left eye exhibits no discharge. No scleral icterus.  Cardiovascular: Normal rate, regular rhythm, normal heart sounds, intact distal pulses and normal pulses.   No murmur heard. Pulmonary/Chest: Effort normal and breath sounds normal. No respiratory distress. She has no wheezes. She has no rhonchi. She has no rales.  Abdominal: Soft. Normal appearance. There is no tenderness. There is no CVA  tenderness.  Genitourinary: Vagina normal.    There is tenderness and lesion (small vesicular lesion with an erythematous base, tender) on the left labia.  Musculoskeletal: Normal range of motion.  Neurological: She is alert and oriented to person, place, and time.  Skin: Skin is warm and dry.  Psychiatric: She has a normal mood and affect. Her speech is normal and behavior is normal. Thought content normal.      Assessment & Plan:  1. Dysuria 2. Genital sore  Patient has a lesion that is very suspicious for genital herpes. Pt was 100% pay so only did a viral culture today. [facility] Mondays. Pt was counseled extensively on disease and prevention. She will take acyclovir for 10 days for first outbreak. We will discuss options for treatment when viral culture comes back.  - POCT UA - Microscopic Only - POCT urinalysis dipstick - Herpes simplex virus culture - acyclovir (ZOVIRAX) 200 MG capsule; Take 1 capsule (200 mg total) by mouth 5 (five) times daily.  Dispense: 50 capsule; Refill: 0   Roswell MinersNicole V. Dyke BrackettBush, PA-C, [facility]  05/08/2014

## 2014-05-12 LAB — HERPES SIMPLEX VIRUS CULTURE: Organism ID, Bacteria: NOT DETECTED

## 2014-05-15 ENCOUNTER — Telehealth: Payer: Self-pay | Admitting: Physician Assistant

## 2014-05-15 DIAGNOSIS — R3989 Other symptoms and signs involving the genitourinary system: Secondary | ICD-10-CM

## 2014-05-15 NOTE — Telephone Encounter (Signed)
Pt rtn Sandra Wilcox call.

## 2014-05-15 NOTE — Telephone Encounter (Signed)
We discussed that her HSV culture was negative. She wants to get blood work to see if positive. She will go to [facility] for STD testing and will see if they do HSV testing as well. If not she will come here. Future order has been put in.

## 2014-05-15 NOTE — Telephone Encounter (Signed)
Called pt and left a voicemail to call me back to discuss lab results.

## 2014-05-16 ENCOUNTER — Other Ambulatory Visit (INDEPENDENT_AMBULATORY_CARE_PROVIDER_SITE_OTHER): Payer: 59 | Admitting: Radiology

## 2014-05-16 DIAGNOSIS — R198 Other specified symptoms and signs involving the digestive system and abdomen: Secondary | ICD-10-CM

## 2014-05-16 DIAGNOSIS — R3989 Other symptoms and signs involving the genitourinary system: Secondary | ICD-10-CM

## 2014-05-16 NOTE — Progress Notes (Signed)
Pt here for labs only. 

## 2014-05-19 ENCOUNTER — Ambulatory Visit (INDEPENDENT_AMBULATORY_CARE_PROVIDER_SITE_OTHER): Payer: 59 | Admitting: Internal Medicine

## 2014-05-19 VITALS — BP 100/60 | HR 85 | Temp 98.2°F | Resp 16 | Ht 65.0 in | Wt 118.0 lb

## 2014-05-19 DIAGNOSIS — R3989 Other symptoms and signs involving the genitourinary system: Secondary | ICD-10-CM

## 2014-05-19 DIAGNOSIS — R198 Other specified symptoms and signs involving the digestive system and abdomen: Secondary | ICD-10-CM

## 2014-05-19 LAB — HSV(HERPES SIMPLEX VRS) I + II AB-IGG: HSV 2 Glycoprotein G Ab, IgG: 6.11 IV — ABNORMAL HIGH

## 2014-05-19 NOTE — Progress Notes (Signed)
   Subjective:    Patient ID: Sandra Wilcox, female    DOB: 10-17-1990, 23 y.o.   MRN: [id number]  HPI Sandra Wilcox is an otherwise healthy 23yo female presenting for f/u on a genital lesion.  She was seen 12/3 for a new, painful, vesicular lesion on her vulva.  HSV cx was negative and 12/11 HSV blood test was obtained.  She took acyclovir 5x a day for 10 days, and after about 5 days she noticed full resolution of the lesion.  Yesterday was her first day off of acyclovir and she noticed that the lesion appeared to have come back and was "spreading down."  She denies pain or burning at the sight, though it does itch.  She also denies fever, n/v, dysuria, urgency and frequency.  There are no blisters at the site, but it looked macerated and white.  She has had 4 lifetime sexual partners, currently monogamous with one female partner for the past 9 mos.  She is planning to go to [facility] tonight for STD testing.  She was last tested a year ago and negative.  She always uses condoms and has a nexplanon for birth control.  She and her partner have not had intercourse since she noticed the lesion.    Review of Systems  Constitutional: Negative for fever and fatigue.  HENT: Negative for congestion and sore throat.   Respiratory: Negative for cough and shortness of breath.   Cardiovascular: Negative for chest pain.  Gastrointestinal: Negative for nausea, vomiting and abdominal pain.  Genitourinary: Positive for genital sores. Negative for dysuria, urgency, frequency, vaginal pain and pelvic pain.  Skin: Negative for rash.  Neurological: Negative for headaches.       Objective:   Filed Vitals:   05/19/14 0817  BP: 100/60  Pulse: 85  Temp: 98.2 F (36.8 C)  Resp: 16    Physical Exam  Constitutional: She is oriented to person, place, and time. She appears well-developed and well-nourished. No distress.  HENT:  Head: Normocephalic and atraumatic.  Mouth/Throat: Oropharynx is clear and moist.    Eyes: Conjunctivae and EOM are normal.  Cardiovascular: Normal rate, regular rhythm and normal heart sounds.   No murmur heard. Pulmonary/Chest: Effort normal and breath sounds normal. No respiratory distress. She has no wheezes. She has no rales.  Genitourinary: Vagina normal.  Normal vulva with small area on left lower side of introitus near labia minora with minimal erythema and white appearance.  No vesicles.  Non-tender.   Neurological: She is alert and oriented to person, place, and time.  Skin: Skin is warm and dry. No rash noted.  Psychiatric: She has a normal mood and affect. Her behavior is normal.       Assessment & Plan:   1. Genital sore -Pt had vesicular lesion concerning for HSV 12/3, and is s/p tx with acyclovir for 10 days -Now previous lesion appears erythematous and white, likely healing mucosa after resolution of vesicles -Not likely a recurrence of HSV given timing, appearance and lack of pain and tenderness  -HSV cx negative, HSV serology pending -Plan to obtain complete STD testing tonight at [facility] -Discussed that lesion could have been herpes v contact dermatitis v friction vesicles and that it is likely healing causing the change in appearance and itching.  Will wait to Rx further acyclovir depending on HSV serology results   Partner here to be tested as well  Dr Brandon Melnickswerlick assisted me with this patient RPD

## 2014-05-20 ENCOUNTER — Telehealth: Payer: Self-pay | Admitting: Radiology

## 2014-05-20 DIAGNOSIS — A6000 Herpesviral infection of urogenital system, unspecified: Secondary | ICD-10-CM

## 2014-05-20 MED ORDER — VALACYCLOVIR HCL 500 MG PO TABS: 500.0000 mg | ORAL_TABLET | Freq: Every day | ORAL | Status: DC

## 2014-05-20 NOTE — Addendum Note (Signed)
Addended by: Tonye PearsonOLITTLE, ROBERT P on: 05/20/2014 03:08 PM   Modules accepted: Level of Service

## 2014-05-20 NOTE — Telephone Encounter (Signed)
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 She is wanting to try suppressive therapy. Sent valtrex to the pharmacy.

## 2014-05-20 NOTE — Telephone Encounter (Signed)
Pt calling about labs °

## 2015-02-26 ENCOUNTER — Ambulatory Visit (INDEPENDENT_AMBULATORY_CARE_PROVIDER_SITE_OTHER): Payer: 59 | Admitting: Emergency Medicine

## 2015-02-26 VITALS — BP 116/68 | HR 89 | Temp 98.3°F | Resp 18 | Ht 65.0 in | Wt 125.0 lb

## 2015-02-26 DIAGNOSIS — M545 Low back pain: Secondary | ICD-10-CM

## 2015-02-26 DIAGNOSIS — N3090 Cystitis, unspecified without hematuria: Secondary | ICD-10-CM | POA: Diagnosis not present

## 2015-02-26 LAB — POCT URINALYSIS DIP (MANUAL ENTRY)
Bilirubin, UA: NEGATIVE
Glucose, UA: NEGATIVE
Ketones, POC UA: NEGATIVE
Leukocytes, UA: NEGATIVE
Nitrite, UA: NEGATIVE
Protein Ur, POC: NEGATIVE
RBC UA: NEGATIVE
UROBILINOGEN UA: 0.2
pH, UA: 5.5

## 2015-02-26 LAB — POC MICROSCOPIC URINALYSIS (UMFC): MUCUS RE: ABSENT

## 2015-02-26 MED ORDER — PHENAZOPYRIDINE HCL 200 MG PO TABS
200.0000 mg | ORAL_TABLET | Freq: Three times a day (TID) | ORAL | Status: DC | PRN
Start: 2015-02-26 — End: 2015-06-17

## 2015-02-26 MED ORDER — CIPROFLOXACIN HCL 250 MG PO TABS: 250.0000 mg | ORAL_TABLET | Freq: Two times a day (BID) | ORAL | Status: DC

## 2015-02-26 NOTE — Patient Instructions (Signed)

## 2015-02-26 NOTE — Progress Notes (Signed)
Subjective:  Patient ID: Sandra Wilcox, female    DOB: 24-Jan-1991  Age: 24 y.o. MRN: [id number]  CC: Urinary Frequency and Back Pain   HPI Kailiana Stump presents  today history of dysuria and frequency and urgency. She has no fever or chills. She has back pain. Not radiating. She started her period yesterday. She denies any nausea vomiting or stool change. No back injury. No radiation of pain numbness tingling or weakness.  History Deserie has a past medical history of Allergy.   She has no past surgical history on file.   Her  family history includes Cancer in her maternal grandfather and mother.  She   reports that she has never smoked. She does not have any smokeless tobacco history on file. She reports that she drinks about 3.6 oz of alcohol per week. She reports that she does not use illicit drugs.  Outpatient Prescriptions Prior to Visit  Medication Sig Dispense Refill  . valACYclovir (VALTREX) 500 MG tablet Take 1 tablet (500 mg total) by mouth daily. 90 tablet 3  . acyclovir (ZOVIRAX) 200 MG capsule Take 1 capsule (200 mg total) by mouth 5 (five) times daily. 50 capsule 0   No facility-administered medications prior to visit.    Social History   Social History  . Marital Status: Single    Spouse Name: N/A  . Number of Children: N/A  . Years of Education: N/A   Social History Main Topics  . Smoking status: Never Smoker   . Smokeless tobacco: None  . Alcohol Use: 3.6 oz/week    6 Shots of liquor per week  . Drug Use: No  . Sexual Activity: Yes    Birth Control/ Protection: Implant   Other Topics Concern  . None   Social History Narrative     Review of Systems  Constitutional: Negative for fever, chills and appetite change.  HENT: Negative for congestion, ear pain, postnasal drip, sinus pressure and sore throat.   Eyes: Negative for pain and redness.  Respiratory: Negative for cough, shortness of breath and wheezing.   Cardiovascular: Negative for leg  swelling.  Gastrointestinal: Negative for nausea, vomiting, abdominal pain, diarrhea, constipation and blood in stool.  Endocrine: Negative for polyuria.  Genitourinary: Positive for urgency and frequency. Negative for dysuria and flank pain.  Musculoskeletal: Positive for back pain. Negative for gait problem.  Skin: Negative for rash.  Neurological: Negative for weakness and headaches.  Psychiatric/Behavioral: Negative for confusion and decreased concentration. The patient is not nervous/anxious.     Objective:  BP 116/68 mmHg  Pulse 89  Temp(Src) 98.3 F (36.8 C)  Resp 18  Ht  (1.651 m)  Wt 125 lb (56.7 kg)  BMI 20.80 kg/m2  SpO2 99%  LMP 02/26/2015  Physical Exam  Constitutional: She is oriented to person, place, and time. She appears well-developed and well-nourished. No distress.  HENT:  Head: Normocephalic and atraumatic.  Right Ear: External ear normal.  Left Ear: External ear normal.  Nose: Nose normal.  Eyes: Conjunctivae and EOM are normal. Pupils are equal, round, and reactive to light. No scleral icterus.  Neck: Normal range of motion. Neck supple. No tracheal deviation present.  Cardiovascular: Normal rate, regular rhythm and normal heart sounds.   Pulmonary/Chest: Effort normal. No respiratory distress. She has no wheezes. She has no rales.  Abdominal: She exhibits no mass. There is no tenderness. There is no rebound and no guarding.  Musculoskeletal: She exhibits no edema.  Lymphadenopathy:  She has no cervical adenopathy.  Neurological: She is alert and oriented to person, place, and time. Coordination normal.  Skin: Skin is warm and dry. No rash noted.  Psychiatric: She has a normal mood and affect. Her behavior is normal.      Assessment & Plan:   Vear was seen today for urinary frequency and back pain.  Diagnoses and all orders for this visit:  Low back pain without sciatica, unspecified back pain laterality   I have discontinued Ms.  Stump's acyclovir. I am also having her maintain her valACYclovir.  No orders of the defined types were placed in this encounter.    Appropriate red flag conditions were discussed with the patient as well as actions that should be taken.  Patient expressed his understanding.  Follow-up: No Follow-up on file.  Carmelina Dane, MD     Results for orders placed or performed in visit on 02/26/15  POCT Microscopic Urinalysis ([facility])  Result Value Ref Range   WBC,UR,HPF,POC Few (A) None WBC/hpf   RBC,UR,HPF,POC Few (A) None RBC/hpf   Bacteria Few (A) None   Mucus Absent Absent   Epithelial Cells, UR Per Microscopy Few (A) None cells/hpf  POCT urinalysis dipstick  Result Value Ref Range   Color, UA yellow yellow   Clarity, UA clear clear   Glucose, UA negative negative   Bilirubin, UA negative negative   Ketones, POC UA negative negative   Spec Grav, UA <=1.005    Blood, UA negative negative   pH, UA 5.5    Protein Ur, POC negative negative   Urobilinogen, UA 0.2    Nitrite, UA Negative Negative   Leukocytes, UA Negative Negative

## 2015-05-25 ENCOUNTER — Other Ambulatory Visit: Payer: Self-pay | Admitting: Physician Assistant

## 2015-06-17 ENCOUNTER — Ambulatory Visit (INDEPENDENT_AMBULATORY_CARE_PROVIDER_SITE_OTHER): Payer: Managed Care, Other (non HMO) | Admitting: Family Medicine

## 2015-06-17 VITALS — BP 108/70 | HR 108 | Temp 99.2°F | Resp 16 | Ht 65.0 in | Wt 129.0 lb

## 2015-06-17 DIAGNOSIS — N9089 Other specified noninflammatory disorders of vulva and perineum: Secondary | ICD-10-CM

## 2015-06-17 DIAGNOSIS — N898 Other specified noninflammatory disorders of vagina: Secondary | ICD-10-CM | POA: Diagnosis not present

## 2015-06-17 LAB — POCT WET + KOH PREP
TRICH BY WET PREP: ABSENT
Yeast by KOH: ABSENT
Yeast by wet prep: ABSENT

## 2015-06-17 MED ORDER — FLUCONAZOLE 150 MG PO TABS: 150.0000 mg | ORAL_TABLET | Freq: Once | ORAL | Status: DC

## 2015-06-17 MED ORDER — VALACYCLOVIR HCL 500 MG PO TABS: ORAL_TABLET | ORAL | Status: DC

## 2015-06-17 MED ORDER — METRONIDAZOLE 0.75 % VA GEL: 1.0000 | Freq: Every day | VAGINAL | Status: DC

## 2015-06-17 NOTE — Progress Notes (Signed)
Subjective:  By signing my name below, I, Stann Oresung-Kai Tsai, attest that this documentation has been prepared under the direction and in the presence of Norberto SorensonEva Ewart Carrera, MD. Electronically Signed: Stann Oresung-Kai Tsai, Scribe. 06/17/2015 , 9:08 AM .  Patient was seen in Room 1 .   Patient ID: Sandra Wilcox, female    DOB: 09-04-1990, 25 y.o.   MRN: [id number] Chief Complaint  Patient presents with  . other    pt has herpes and notice a sore and wanted to get it checked out.   HPI Sandra Wilcox is a 25 y.o. female who has h/o genital sore [facility] complaining of a blister over her genitalia.  She was seen about a year ago for genital sore, thought to be HSV and was treated with acyclovir for 10 days. 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 Unknown if outbreak last year was HSV versus contact dermatitis versus friction.   Since her visit last year, she's been maintained on valtrex for suppression. She has had no outbreaks until 3-4 days ago when she developed several poorly defined genital sores with small white vaginal discharge. After the first 2 days, the pain and discharge have significantly improved. She has not used any OTC treatments or at home other than her valtrex. She does cleanse herself with some moisturized wet wipes otherwise no other. She's been in a monogamous relationship for over 1 year, and 4 lifetime partners. Her last STD testing was 1 year at [facility] but believed they just checked for HIV. She has a nexplanon in for contraception that was placed 2 years ago. She had 1 pap smear several years ago that was normal. She has an appointment with a new doctor next month for full physical; however, she does not remember who or where.    Past Medical History  Diagnosis Date  . Allergy    Prior to Admission medications   Medication Sig Start Date End Date Taking? Authorizing Provider  valACYclovir (VALTREX) 500 MG tablet TAKE ONE TABLET BY MOUTH ONE TIME  DAILY   "OFFICE VISIT NEEDED FOR REFILLS" 05/25/15  Yes Lanier ClamNicole Bush V, PA-C  ciprofloxacin (CIPRO) 250 MG tablet Take 1 tablet (250 mg total) by mouth 2 (two) times daily. Patient not taking: Reported on 06/17/2015 02/26/15   Carmelina DaneJeffery S Anderson, MD  phenazopyridine (PYRIDIUM) 200 MG tablet Take 1 tablet (200 mg total) by mouth 3 (three) times daily as needed. Patient not taking: Reported on 06/17/2015 02/26/15   Carmelina DaneJeffery S Anderson, MD   No Known Allergies  Review of Systems  Constitutional: Negative for fever, chills and fatigue.  Respiratory: Negative for cough, shortness of breath and wheezing.   Gastrointestinal: Negative for nausea, vomiting, abdominal pain, diarrhea and constipation.  Genitourinary: Positive for genital sores. Negative for dysuria, decreased urine volume and pelvic pain.       Objective:   Physical Exam  Constitutional: She is oriented to person, place, and time. She appears well-developed and well-nourished. No distress.  HENT:  Head: Normocephalic and atraumatic.  Eyes: EOM are normal. Pupils are equal, round, and reactive to light.  Neck: Neck supple.  Cardiovascular: Normal rate.   Pulmonary/Chest: Effort normal. No respiratory distress.  Genitourinary:  Labia majora: normal  Labia minora: poorly defined mildly erythematous oval area approximately 6x513mm on right internal lower labia minora, smaller pinpoint erythematous lesion on left external labia minora Vaginal vault and cervix: normal with mild amount of thick white vaginal discharge, no tenderness  or friability   Musculoskeletal: Normal range of motion.  Neurological: She is alert and oriented to person, place, and time.  Skin: Skin is warm and dry.  Psychiatric: She has a normal mood and affect. Her behavior is normal.  Nursing note and vitals reviewed.  BP 108/70 mmHg  Pulse 108  Temp(Src) 99.2 F (37.3 C) (Oral)  Resp 16  Ht 5\' 5"  (1.651 m)  Wt 129 lb (58.514 kg)  BMI 21.47 kg/m2  SpO2 99%   LMP 05/28/2015     Results for orders placed or performed in visit on 06/17/15  POCT Wet + KOH Prep  Result Value Ref Range   Yeast by KOH Absent Present, Absent   Yeast by wet prep Absent Present, Absent   WBC by wet prep Moderate (A) None, Few, Too numerous to count   Clue Cells Wet Prep HPF POC Few (A) None, Too numerous to count   Trich by wet prep Absent Present, Absent   Bacteria Wet Prep HPF POC Moderate (A) None, Few, Too numerous to count   Epithelial Cells By [employer] [facility]) Moderate (A) None, Few, Too numerous to count   RBC,UR,HPF,POC None None RBC/hpf    Assessment & Plan:   1. Vulvar lesion   2. Vaginal discharge   wet prep is negative but def vaginal discharge on exam that looks to me like yeast so will cover for that as well as vag metrogel supp.  The labia minora "sores" do not have any of the typical white edge ulceration of HSV nor is she having any significant tenderness so I suspect that this might not actually be HSV but will have her bump up her valtrex to bid x 3d during acute outbreak to cover while HSV clx P.  Suspect this is actually more of an abrasion (thongs and skinny jeans make a develish combo?) but if they cont to occur may be wise to see gyn during one to ensure biopsy is not needed if HSV clx is neg. Pt agrees and understands. Has CPE sched next mo with pap and will get rest of routine sti testing done at that time.  Orders Placed This Encounter  Procedures  . GC/Chlamydia Probe Amp  . Herpes simplex virus culture  . POCT Wet + KOH Prep    Meds ordered this encounter  Medications  . valACYclovir (VALTREX) 500 MG tablet    Sig: TAKE ONE TABLET BY MOUTH ONE TIME DAILY. Increase to 1 tabs twice a day for 3 days immediately upon outbreak    Dispense:  90 tablet    Refill:  4  . metroNIDAZOLE (METROGEL VAGINAL) 0.75 % vaginal gel    Sig: Place 1 Applicatorful vaginally at bedtime. x5d    Dispense:  70 g    Refill:  0  . fluconazole (DIFLUCAN)  150 MG tablet    Sig: Take 1 tablet (150 mg total) by mouth once.    Dispense:  1 tablet    Refill:  0    I personally performed the services described in this documentation, which was scribed in my presence. The recorded information has been reviewed and considered, and addended by me as needed.  Norberto Sorenson, MD MPH

## 2015-06-17 NOTE — Patient Instructions (Signed)
When you get your full physical done next month, have the check HIV, Syphilis, Hep C - we did not do these today.  You are also likely do for your pap smear and make sure you have completed the full HPV vaccination series before you turn 25 yo though you are to young to be tested for high risk HPV (not recommended until after 40).  If this does not turn out to be herpes, I would recommend that we get you in with a gynecologist so the next time you have an outbreak, they can take a look or do a biopsy to see what it is. . . .  Talk with your new PCP about it.   Genital Herpes Genital herpes is a common sexually transmitted infection (STI) that is caused by a virus. The virus is spread from person to person through sexual contact. Infection can cause itching, blisters, and sores in the genital area or rectal area. This is called an outbreak. It affects both men and women. Genital herpes is particularly concerning for pregnant women because the virus can be passed to the baby during delivery and cause serious problems. Genital herpes is also a concern for people with a weakened defense (immune) system. Symptoms of genital herpes may last several days and then go away. However, the virus remains in your body, so you may have more outbreaks of symptoms in the future. The time between outbreaks varies and can be months or years. CAUSES Genital herpes is caused by a virus called herpes simplex virus (HSV) type 2 or HSV type 1. These viruses are contagious and are most often spread through sexual contact with an infected person. Sexual contact includes vaginal, anal, and oral sex. RISK FACTORS Risk factors for genital herpes include:  Being sexually active with multiple partners.  Having unprotected sex. SIGNS AND SYMPTOMS Symptoms may include:  Pain and itching in the genital area or rectal area.  Small red bumps that turn into blisters and then turn into sores.  Flu-like symptoms,  including:  Fever.  Body aches.  Painful urination.  Vaginal discharge. DIAGNOSIS Genital herpes may be diagnosed by:  Physical exam.  Blood test.  Fluid culture test from an open sore. TREATMENT There is no cure for genital herpes. Oral antiviral medicines may be used to speed up healing and to help prevent the return of symptoms. These medicines can also help to reduce the spread of the virus to sexual partners. HOME CARE INSTRUCTIONS  Keep the affected areas dry and clean.  Take medicines only as directed by your health care provider.  Do not have sexual contact during active infections. Genital herpes is contagious.  Practice safe sex. Latex condoms and female condoms may help to prevent the spread of the herpes virus.  Avoid rubbing or touching the blisters and sores. If you do touch the blister or sores:  Wash your hands thoroughly.  Do not touch your eyes afterward.  If you become pregnant, tell your health care provider if you have had genital herpes.  Keep all follow-up visits as directed by your health care provider. This is important. PREVENTION  Use condoms. Although anyone can contract genital herpes during sexual contact even with the use of a condom, a condom can provide some protection.  Avoid having multiple sexual partners.  Talk to your sexual partner about any symptoms and past history that either of you may have.  Get tested before you have sex. Ask your partner to do the  same.  Recognize the symptoms of genital herpes. Do not have sexual contact if you notice these symptoms. SEEK MEDICAL CARE IF:  Your symptoms are not improving with medicine.  Your symptoms return.  You have new symptoms.  You have a fever.  You have abdominal pain.  You have redness, swelling, or pain in your eye. MAKE SURE YOU:  Understand these instructions.  Will watch your condition.  Will get help right away if you are not doing well or get worse.    This information is not intended to replace advice given to you by your health care provider. Make sure you discuss any questions you have with your health care provider.   Document Released: 05/20/2000 Document Revised: 06/13/2014 Document Reviewed: 10/08/2013 Elsevier Interactive Patient Education [employer]

## 2015-06-18 LAB — GC/CHLAMYDIA PROBE AMP
CT PROBE, AMP APTIMA: NOT DETECTED
GC PROBE AMP APTIMA: NOT DETECTED

## 2015-06-19 LAB — HERPES SIMPLEX VIRUS CULTURE: Organism ID, Bacteria: NOT DETECTED

## 2015-11-15 ENCOUNTER — Encounter (HOSPITAL_COMMUNITY): Payer: Self-pay | Admitting: Emergency Medicine

## 2015-11-15 ENCOUNTER — Emergency Department (HOSPITAL_COMMUNITY): Payer: BC Managed Care – PPO

## 2015-11-15 ENCOUNTER — Emergency Department (HOSPITAL_COMMUNITY)
Admission: EM | Admit: 2015-11-15 | Discharge: 2015-11-15 | Disposition: A | Discharge status: Home / Self Care | Payer: BC Managed Care – PPO | Attending: Emergency Medicine | Admitting: Emergency Medicine

## 2015-11-15 DIAGNOSIS — R110 Nausea: Secondary | ICD-10-CM | POA: Diagnosis not present

## 2015-11-15 DIAGNOSIS — Z79899 Other long term (current) drug therapy: Secondary | ICD-10-CM | POA: Insufficient documentation

## 2015-11-15 DIAGNOSIS — R109 Unspecified abdominal pain: Secondary | ICD-10-CM

## 2015-11-15 DIAGNOSIS — R1011 Right upper quadrant pain: Secondary | ICD-10-CM | POA: Diagnosis present

## 2015-11-15 LAB — COMPREHENSIVE METABOLIC PANEL
ALT: 13 U/L — AB (ref 14–54)
AST: 16 U/L (ref 15–41)
Albumin: 4.5 g/dL (ref 3.5–5.0)
Alkaline Phosphatase: 40 U/L (ref 38–126)
Anion gap: 6 (ref 5–15)
BILIRUBIN TOTAL: 0.8 mg/dL (ref 0.3–1.2)
BUN: 8 mg/dL (ref 6–20)
CO2: 25 mmol/L (ref 22–32)
CREATININE: 0.56 mg/dL (ref 0.44–1.00)
Calcium: 9 mg/dL (ref 8.9–10.3)
Chloride: 108 mmol/L (ref 101–111)
GFR calc Af Amer: >60 mL/min (ref >60)
Glucose, Bld: 85 mg/dL (ref 65–99)
Potassium: 3.9 mmol/L (ref 3.5–5.1)
Sodium: 139 mmol/L (ref 135–145)
Total Protein: 7.3 g/dL (ref 6.5–8.1)

## 2015-11-15 LAB — URINALYSIS, ROUTINE W REFLEX MICROSCOPIC
Bilirubin Urine: NEGATIVE
GLUCOSE, UA: NEGATIVE mg/dL
HGB URINE DIPSTICK: NEGATIVE
Ketones, ur: NEGATIVE mg/dL
Nitrite: NEGATIVE
Protein, ur: NEGATIVE mg/dL
SPECIFIC GRAVITY, URINE: 1.007 (ref 1.005–1.030)
pH: 7.5 (ref 5.0–8.0)

## 2015-11-15 LAB — URINE MICROSCOPIC-ADD ON
Bacteria, UA: NONE SEEN
RBC / HPF: NONE SEEN RBC/hpf (ref 0–5)

## 2015-11-15 LAB — CBC WITH DIFFERENTIAL/PLATELET
Basophils Absolute: 0 10*3/uL (ref 0.0–0.1)
Basophils Relative: 0 %
EOS ABS: 0.1 10*3/uL (ref 0.0–0.7)
Eosinophils Relative: 1 %
HEMATOCRIT: 39.9 % (ref 36.0–46.0)
HEMOGLOBIN: 13.8 g/dL (ref 12.0–15.0)
LYMPHS ABS: 1.4 10*3/uL (ref 0.7–4.0)
Lymphocytes Relative: 20 %
MCH: 31.7 pg (ref 26.0–34.0)
MCHC: 34.6 g/dL (ref 30.0–36.0)
MCV: 91.5 fL (ref 78.0–100.0)
MONOS PCT: 4 %
Monocytes Absolute: 0.3 10*3/uL (ref 0.1–1.0)
NEUTROS PCT: 75 %
Neutro Abs: 5.1 10*3/uL (ref 1.7–7.7)
Platelets: 301 10*3/uL (ref 150–400)
RBC: 4.36 MIL/uL (ref 3.87–5.11)
RDW: 12.4 % (ref 11.5–15.5)
WBC: 6.9 10*3/uL (ref 4.0–10.5)

## 2015-11-15 LAB — LIPASE, BLOOD: Lipase: 26 U/L (ref 11–51)

## 2015-11-15 LAB — PREGNANCY, URINE: Preg Test, Ur: NEGATIVE

## 2015-11-15 MED ORDER — ONDANSETRON HCL 4 MG PO TABS: 4.0000 mg | ORAL_TABLET | Freq: Three times a day (TID) | ORAL | Status: DC | PRN

## 2015-11-15 MED ORDER — HYDROCODONE-ACETAMINOPHEN 5-325 MG PO TABS: ORAL_TABLET | ORAL | Status: DC

## 2015-11-15 MED ORDER — ONDANSETRON HCL 4 MG/2ML IJ SOLN: 4.0000 mg | INTRAMUSCULAR | Status: DC | PRN

## 2015-11-15 MED ORDER — MORPHINE SULFATE (PF) 2 MG/ML IV SOLN: 2.0000 mg | INTRAVENOUS | Status: DC | PRN

## 2015-11-15 NOTE — Discharge Instructions (Signed)
Eat a bland diet, avoiding greasy, fatty, fried foods, as well as spicy and acidic foods or beverages.  Avoid eating within the hour or 2 before going to bed or laying down.  Also avoid teas, colas, coffee, chocolate, pepermint and spearment.  Take over the counter pepcid, one tablet by mouth twice a day, for the next 2 to 3 weeks.  May also take over the counter maalox/mylanta, as directed on packaging, as needed for discomfort.  Take the prescriptions as directed.  Call your regular medical doctor tomorrow to schedule a follow up appointment in the next 2 days. Call the GI doctor tomorrow to schedule a follow up appointment within the next week.  Return to the Emergency Department immediately if worsening.

## 2015-11-15 NOTE — ED Notes (Signed)
Wed upper right abd pain that has been "intermittent for a while, probably just a couple of months." Occasional nausea, PCP states it may be related to her birth control. Pt states the pain got worse this week and has continued to worsen. Occasionally spreads across to LUQ

## 2015-11-15 NOTE — ED Provider Notes (Signed)
CSN: [id number]     Arrival date & time 11/15/15  1000 History   First MD Initiated Contact with Patient 11/15/15 1007     Chief Complaint  Patient presents with  . Abdominal Pain  . Nausea     HPI Pt was seen at 1005.  Per pt, c/o gradual onset and persistence of constant RUQ abd "pain" for the past several months, worse over the past 4 days.  Has been associated with nausea.  Describes the abd pain as worsening after eating and sometimes radiating into her LUQ.  Denies vomiting/diarrhea, no fevers, no back pain, no rash, no CP/SOB, no black or blood in stools. Pt states she was evaluated by her PMD for this complaint and was told "it might be from by birth control."    Past Medical History  Diagnosis Date  . Allergy    History reviewed. No pertinent past surgical history.   Family History  Problem Relation Age of Onset  . Cancer Mother     skin- melanoma  . Cancer Maternal Grandfather     skin   Social History  Substance Use Topics  . Smoking status: Never Smoker   . Smokeless tobacco: None  . Alcohol Use: 3.6 oz/week    6 Shots of liquor per week    Review of Systems ROS: Statement: All systems negative except as marked or noted in the HPI; Constitutional: Negative for fever and chills. ; ; Eyes: Negative for eye pain, redness and discharge. ; ; ENMT: Negative for ear pain, hoarseness, nasal congestion, sinus pressure and sore throat. ; ; Cardiovascular: Negative for chest pain, palpitations, diaphoresis, dyspnea and peripheral edema. ; ; Respiratory: Negative for cough, wheezing and stridor. ; ; Gastrointestinal: +abd pain, nausea. Negative for vomiting, diarrhea, blood in stool, hematemesis, jaundice and rectal bleeding. . ; ; Genitourinary: Negative for dysuria, flank pain and hematuria. ; ; Musculoskeletal: Negative for back pain and neck pain. Negative for swelling and trauma.; ; Skin: Negative for pruritus, rash, abrasions, blisters, bruising and skin lesion.; ; Neuro:  Negative for headache, lightheadedness and neck stiffness. Negative for weakness, altered level of consciousness, altered mental status, extremity weakness, paresthesias, involuntary movement, seizure and syncope.      Allergies  Shellfish allergy  Home Medications   Prior to Admission medications   Medication Sig Start Date End Date Taking? Authorizing Provider  diphenhydramine-acetaminophen (TYLENOL PM) 25-500 MG TABS tablet Take 1 tablet by mouth at bedtime as needed (pain, sleep).   Yes Historical Provider, MD  etonogestrel (NEXPLANON) 68 MG IMPL implant Inject 68 mg into the skin once.   Yes Historical Provider, MD   BP 122/80 mmHg  Pulse 91  Temp(Src) 98.6 F (37 C) (Oral)  Resp 18  SpO2 100%  LMP 10/26/2015 Physical Exam  1010: Physical examination:  Nursing notes reviewed; Vital signs and O2 SAT reviewed;  Constitutional: Well developed, Well nourished, Well hydrated, In no acute distress; Head:  Normocephalic, atraumatic; Eyes: EOMI, PERRL, No scleral icterus; ENMT: Mouth and pharynx normal, Mucous membranes moist; Neck: Supple, Full range of motion, No lymphadenopathy; Cardiovascular: Regular rate and rhythm, No murmur, rub, or gallop; Respiratory: Breath sounds clear & equal bilaterally, No rales, rhonchi, wheezes.  Speaking full sentences with ease, Normal respiratory effort/excursion; Chest: Nontender, Movement normal; Abdomen: Soft, +mild RUQ tenderness to palp. No rebound or guarding. Nondistended, Normal bowel sounds; Genitourinary: No CVA tenderness; Extremities: Pulses normal, No tenderness, No edema, No calf edema or asymmetry.; Neuro: AA&Ox3, Major CN grossly  intact.  Speech clear. No gross focal motor or sensory deficits in extremities.; Skin: Color normal, Warm, Dry.   ED Course  Procedures (including critical care time) Labs Review   Imaging Review  I have personally reviewed and evaluated these images and lab results as part of my medical decision-making.    EKG Interpretation None      MDM  MDM Reviewed: previous chart, nursing note and vitals Reviewed previous: labs Interpretation: labs, x-ray and ultrasound     Results for orders placed or performed during the hospital encounter of 11/15/15  Pregnancy, urine  Result Value Ref Range   Preg Test, Ur NEGATIVE NEGATIVE  Urinalysis, Routine w reflex microscopic  Result Value Ref Range   Color, Urine YELLOW YELLOW   APPearance CLEAR CLEAR   Specific Gravity, Urine 1.007 1.005 - 1.030   pH 7.5 5.0 - 8.0   Glucose, UA NEGATIVE NEGATIVE mg/dL   Hgb urine dipstick NEGATIVE NEGATIVE   Bilirubin Urine NEGATIVE NEGATIVE   Ketones, ur NEGATIVE NEGATIVE mg/dL   Protein, ur NEGATIVE NEGATIVE mg/dL   Nitrite NEGATIVE NEGATIVE   Leukocytes, UA SMALL (A) NEGATIVE  Comprehensive metabolic panel  Result Value Ref Range   Sodium 139 135 - 145 mmol/L   Potassium 3.9 3.5 - 5.1 mmol/L   Chloride 108 101 - 111 mmol/L   CO2 25 22 - 32 mmol/L   Glucose, Bld 85 65 - 99 mg/dL   BUN 8 6 - 20 mg/dL   Creatinine, Ser 1.61 0.44 - 1.00 mg/dL   Calcium 9.0 8.9 - 09.6 mg/dL   Total Protein 7.3 6.5 - 8.1 g/dL   Albumin 4.5 3.5 - 5.0 g/dL   AST 16 15 - 41 U/L   ALT 13 (L) 14 - 54 U/L   Alkaline Phosphatase 40 38 - 126 U/L   Total Bilirubin 0.8 0.3 - 1.2 mg/dL   GFR calc non Af Amer >60 >60 mL/min   GFR calc Af Amer >60 >60 mL/min   Anion gap 6 5 - 15  Lipase, blood  Result Value Ref Range   Lipase 26 11 - 51 U/L  CBC with Differential  Result Value Ref Range   WBC 6.9 4.0 - 10.5 K/uL   RBC 4.36 3.87 - 5.11 MIL/uL   Hemoglobin 13.8 12.0 - 15.0 g/dL   HCT 04.5 40.9 - 81.1 %   MCV 91.5 78.0 - 100.0 fL   MCH 31.7 26.0 - 34.0 pg   MCHC 34.6 30.0 - 36.0 g/dL   RDW 91.4 78.2 - 95.6 %   Platelets 301 150 - 400 K/uL   Neutrophils Relative % 75 %   Neutro Abs 5.1 1.7 - 7.7 K/uL   Lymphocytes Relative 20 %   Lymphs Abs 1.4 0.7 - 4.0 K/uL   Monocytes Relative 4 %   Monocytes Absolute 0.3 0.1 - 1.0  K/uL   Eosinophils Relative 1 %   Eosinophils Absolute 0.1 0.0 - 0.7 K/uL   Basophils Relative 0 %   Basophils Absolute 0.0 0.0 - 0.1 K/uL  Urine microscopic-add on  Result Value Ref Range   Squamous Epithelial / LPF 6-30 (A) NONE SEEN   WBC, UA 0-5 0 - 5 WBC/hpf   RBC / HPF NONE SEEN 0 - 5 RBC/hpf   Bacteria, UA NONE SEEN NONE SEEN   Dg Chest 2 View 11/15/2015  CLINICAL DATA:  Intermittent upper abdominal pain EXAM: CHEST  2 VIEW COMPARISON:  None. FINDINGS: The heart size and mediastinal  contours are within normal limits. Both lungs are clear. The visualized skeletal structures are unremarkable. IMPRESSION: No active cardiopulmonary disease. Electronically Signed   By: Natasha Mead M.D.   On: 11/15/2015 10:49   US Abdomen Complete 11/15/2015  CLINICAL DATA:  25 year old female with upper abdominal pain for the past 5 days EXAM: ABDOMEN ULTRASOUND COMPLETE COMPARISON:  None. FINDINGS: Gallbladder: No gallstones or wall thickening visualized. No sonographic Murphy sign noted by sonographer. Common bile duct: Diameter: Within normal limits at 1 mm Liver: No focal lesion identified. Within normal limits in parenchymal echogenicity. Main portal vein is patent with normal hepatopetal flow. IVC: No abnormality visualized. Pancreas: Visualized portion unremarkable. Spleen: Size and appearance within normal limits. Right Kidney: Length: 11.9 cm. Echogenicity within normal limits. No mass or hydronephrosis visualized. Left Kidney: Length: 12.1 cm. Echogenicity within normal limits. No mass or hydronephrosis visualized. Abdominal aorta: No aneurysm visualized. Other findings: None. IMPRESSION: Unremarkable abdominal ultrasound. Electronically Signed   By: Malachy Moan M.D.   On: 11/15/2015 12:06    1235:  Pt has tol PO well while in the ED without N/V.  No stooling while in the ED.  Abd benign, VSS. Feels better and wants to go home now. Tx symptomatically, f/u GI MD. Dx and testing d/w pt and family.   Questions answered.  Verb understanding, agreeable to d/c home with outpt f/u.    Samuel Jester, DO 11/16/15 1641

## 2015-11-15 NOTE — ED Notes (Signed)
Attempted IV once in left lower arm, attempt unsuccessful but was able to draw blood.

## 2015-11-15 NOTE — ED Notes (Signed)
US called, stated it'd be an hour before they would be able to get to the patient.

## 2016-08-29 ENCOUNTER — Other Ambulatory Visit (HOSPITAL_COMMUNITY): Payer: Self-pay | Admitting: Gastroenterology

## 2016-08-29 DIAGNOSIS — R1011 Right upper quadrant pain: Secondary | ICD-10-CM

## 2016-09-02 ENCOUNTER — Ambulatory Visit (HOSPITAL_COMMUNITY)
Admission: RE | Admit: 2016-09-02 | Discharge: 2016-09-02 | Disposition: A | Discharge status: Home / Self Care | Payer: BC Managed Care – PPO | Source: Ambulatory Visit | Attending: Gastroenterology | Admitting: Gastroenterology

## 2016-09-02 DIAGNOSIS — R1011 Right upper quadrant pain: Secondary | ICD-10-CM | POA: Diagnosis present

## 2016-09-02 MED ORDER — TECHNETIUM TC 99M SULFUR COLLOID FILTERED
1.0000 | Freq: Once | INTRAVENOUS | Status: AC | PRN
  Administered 2016-09-02: 1 via INTRADERMAL

## 2018-11-23 IMAGING — NM NM HEPATO W/GB/PHARM/[PERSON_NAME]
3 series · 13 of 13 positions shown · non-contrast
Comparison: Ultrasound 11/15/2015.

CLINICAL DATA: Abdominal pain.

EXAM:
NUCLEAR MEDICINE HEPATOBILIARY IMAGING WITH GALLBLADDER EF
TECHNIQUE: Sequential images of the abdomen were obtained [DATE] minutes
following intravenous administration of radiopharmaceutical. After
oral ingestion of Ensure, gallbladder ejection fraction was
determined. At 60 min, normal ejection fraction is greater than 33%.
RADIOPHARMACEUTICALS:  5.1 mCi Sc-NNm  Choletec IV

[Series 1: biliary · 4.14mm/px · 6 of 60 frames shown]
[frame 6/60]
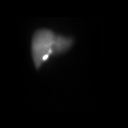
[frame 16/60]
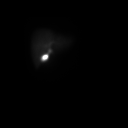
[frame 26/60]
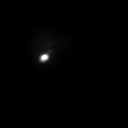
[frame 36/60]
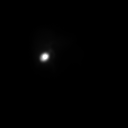
[frame 46/60]
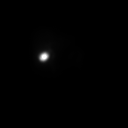
[frame 56/60]
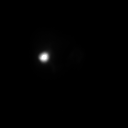

[Series 2: rt lat · 4.14mm/px · 1 of 1 slices shown]
[im 1/1]
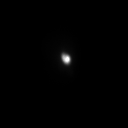

[Series 3: gbef · 4.14mm/px · 6 of 60 frames shown]
[frame 6/60]
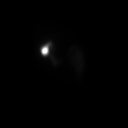
[frame 16/60]
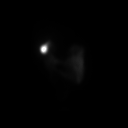
[frame 26/60]
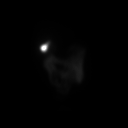
[frame 36/60]
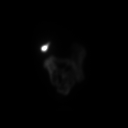
[frame 46/60]
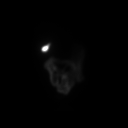
[frame 56/60]
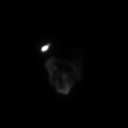

[13 of 13 positions shown; findings below may reference images not displayed]

FINDINGS: Prompt uptake and biliary excretion of activity by the liver is
seen. Gallbladder activity is visualized, consistent with patency of
cystic duct. Biliary activity passes into small bowel, consistent
with patent common bile duct.

Calculated gallbladder ejection fraction is 54%. (Normal gallbladder
ejection fraction with Ensure is greater than 33%.)
IMPRESSION: No acute abnormality identified.  Normal exam.

## 2018-12-11 ENCOUNTER — Other Ambulatory Visit: Payer: Self-pay

## 2018-12-11 ENCOUNTER — Inpatient Hospital Stay (HOSPITAL_COMMUNITY)
Admission: AD | Admit: 2018-12-11 | Discharge: 2018-12-11 | Disposition: A | Discharge status: Home / Self Care | Payer: Managed Care, Other (non HMO) | Attending: Obstetrics and Gynecology | Admitting: Obstetrics and Gynecology

## 2018-12-11 ENCOUNTER — Encounter (HOSPITAL_COMMUNITY): Payer: Self-pay | Admitting: *Deleted

## 2018-12-11 ENCOUNTER — Inpatient Hospital Stay (HOSPITAL_COMMUNITY): Payer: Managed Care, Other (non HMO)

## 2018-12-11 DIAGNOSIS — O209 Hemorrhage in early pregnancy, unspecified: Secondary | ICD-10-CM | POA: Insufficient documentation

## 2018-12-11 DIAGNOSIS — Z3A01 Less than 8 weeks gestation of pregnancy: Secondary | ICD-10-CM | POA: Diagnosis not present

## 2018-12-11 DIAGNOSIS — O3680X0 Pregnancy with inconclusive fetal viability, not applicable or unspecified: Secondary | ICD-10-CM

## 2018-12-11 LAB — URINALYSIS, ROUTINE W REFLEX MICROSCOPIC
Bacteria, UA: NONE SEEN
Bilirubin Urine: NEGATIVE
Glucose, UA: NEGATIVE mg/dL
Ketones, ur: NEGATIVE mg/dL
Leukocytes,Ua: NEGATIVE
Nitrite: NEGATIVE
Protein, ur: NEGATIVE mg/dL
Specific Gravity, Urine: 1.013 (ref 1.005–1.030)
pH: 6 (ref 5.0–8.0)

## 2018-12-11 LAB — TYPE AND SCREEN
ABO/RH(D): O POS
Antibody Screen: NEGATIVE

## 2018-12-11 LAB — POCT PREGNANCY, URINE: Preg Test, Ur: POSITIVE — AB

## 2018-12-11 LAB — CBC
HCT: 38.5 % (ref 36.0–46.0)
Hemoglobin: 13.4 g/dL (ref 12.0–15.0)
MCH: 32.1 pg (ref 26.0–34.0)
MCHC: 34.8 g/dL (ref 30.0–36.0)
MCV: 92.3 fL (ref 80.0–100.0)
Platelets: 281 10*3/uL (ref 150–400)
RBC: 4.17 MIL/uL (ref 3.87–5.11)
RDW: 11.6 % (ref 11.5–15.5)
WBC: 3.5 10*3/uL — ABNORMAL LOW (ref 4.0–10.5)
nRBC: 0 % (ref 0.0–0.2)

## 2018-12-11 LAB — WET PREP, GENITAL
Clue Cells Wet Prep HPF POC: NONE SEEN
Sperm: NONE SEEN
Trich, Wet Prep: NONE SEEN
Yeast Wet Prep HPF POC: NONE SEEN

## 2018-12-11 LAB — HCG, QUANTITATIVE, PREGNANCY: hCG, Beta Chain, Quant, S: 211 m[IU]/mL — ABNORMAL HIGH (ref <5)

## 2018-12-11 LAB — ABO/RH: ABO/RH(D): O POS

## 2018-12-11 NOTE — MAU Note (Signed)
Should be about 5 wks preg. +HPT.  Light bleeding after intercourse yesterday, became heavier during the day and passed some clots. Heavier then spotting, no clots this morning, had some cramping yesterday, none today.  Called OB, was told to come here.

## 2018-12-11 NOTE — MAU Provider Note (Signed)
History     CSN: [id number]  Arrival date and time: 12/11/18 1028   First Provider Initiated Contact with Patient 12/11/18 1130      Chief Complaint  Patient presents with  . Vaginal Bleeding  . Abdominal Pain  . Possible Pregnancy   Sandra Wilcox is a 28 y.o. G1P0 at 5254w3d who receives care at [facility].  She presents today for Vaginal Bleeding, Abdominal Pain, and Possible Pregnancy.  She reports having a positive pregnancy test x 3 at the end of June.  She states yesterday she had intercourse and started to have light spotting that turned into bleeding with passing of clots.  She states the clots were about "half an inch" and were "stringy."  She reports having abdominal cramping that she rated about 3/10.  She reports she continues to spot this morning and it is "a little heavier."  Patient denies pain or discomfort at current.      OB History    Gravida  1   Para      Term      Preterm      AB      Living        SAB      TAB      Ectopic      Multiple      Live Births              Past Medical History:  Diagnosis Date  . Allergy     History reviewed. No pertinent surgical history.  Family History  Problem Relation Age of Onset  . Cancer Mother        skin- melanoma  . Cancer Maternal Grandfather        skin    Social History   Tobacco Use  . Smoking status: Never Smoker  . Smokeless tobacco: Never Used  Substance Use Topics  . Alcohol use: Not Currently    Alcohol/week: 6.0 standard drinks    Types: 6 Shots of liquor per week  . Drug use: No    Allergies:  Allergies  Allergen Reactions  . Shellfish Allergy Swelling    Okay to take all other shellfish, allergic to muscles- cause swelling of face, lips, chin    Medications Prior to Admission  Medication Sig Dispense Refill Last Dose  . diphenhydramine-acetaminophen (TYLENOL PM) 25-500 MG TABS tablet Take 1 tablet by mouth at bedtime as needed (pain, sleep).     Marland Kitchen.  etonogestrel (NEXPLANON) 68 MG IMPL implant Inject 68 mg into the skin once.     Marland Kitchen. HYDROcodone-acetaminophen (NORCO/VICODIN) 5-325 MG tablet 1 or 2 tabs PO q6 hours prn pain 12 tablet 0   . ondansetron (ZOFRAN) 4 MG tablet Take 1 tablet (4 mg total) by mouth every 8 (eight) hours as needed for nausea or vomiting. 6 tablet 0     Review of Systems  Constitutional: Negative for chills and fever.  Eyes: Negative for visual disturbance.  Respiratory: Negative for cough and shortness of breath.   Gastrointestinal: Positive for nausea. Negative for abdominal pain, constipation, diarrhea and vomiting.  Genitourinary: Positive for vaginal bleeding. Negative for difficulty urinating, dysuria, pelvic pain and vaginal discharge.  Musculoskeletal: Negative for back pain.  Neurological: Negative for dizziness, light-headedness and headaches.   Physical Exam   Blood pressure 127/71, pulse (!) 101, temperature 98.1 F (36.7 C), temperature source Oral, resp. rate 16, height 5\' 4"  (1.626 m), weight 61.4 kg, last menstrual period 11/03/2018, SpO2 100 %.  Physical Exam  Constitutional: She is oriented to person, place, and time. She appears well-developed and well-nourished.  HENT:  Head: Normocephalic and atraumatic.  Eyes: Conjunctivae are normal.  Neck: Normal range of motion.  Cardiovascular: Normal rate.  Respiratory: Effort normal.  GI: Soft.  Genitourinary: Cervix exhibits no motion tenderness and no friability.    Vaginal bleeding present.     No vaginal discharge.  There is bleeding in the vagina.    Genitourinary Comments: Speculum Exam: -Vaginal Vault: Pink mucosa.  Small amt dark red blood in vault.  Removed with faux swab x 1 -wet prep collected -Cervix:Pink, no lesions, cysts, or polyps.  Appears closed. Small active bleeding from os-GC/CT collected -Bimanual Exam: Closed    Musculoskeletal: Normal range of motion.  Neurological: She is alert and oriented to person, place, and time.   Skin: Skin is dry.  Psychiatric: She has a normal mood and affect. Her behavior is normal.    MAU Course  Procedures Results for orders placed or performed during the hospital encounter of 12/11/18 (from the past 24 hour(s))  Urinalysis, Routine w reflex microscopic     Status: Abnormal   Collection Time: 12/11/18 10:56 AM  Result Value Ref Range   Color, Urine YELLOW YELLOW   APPearance CLEAR CLEAR   Specific Gravity, Urine 1.013 1.005 - 1.030   pH 6.0 5.0 - 8.0   Glucose, UA NEGATIVE NEGATIVE mg/dL   Hgb urine dipstick LARGE (A) NEGATIVE   Bilirubin Urine NEGATIVE NEGATIVE   Ketones, ur NEGATIVE NEGATIVE mg/dL   Protein, ur NEGATIVE NEGATIVE mg/dL   Nitrite NEGATIVE NEGATIVE   Leukocytes,Ua NEGATIVE NEGATIVE   RBC / HPF 0-5 0 - 5 RBC/hpf   WBC, UA 0-5 0 - 5 WBC/hpf   Bacteria, UA NONE SEEN NONE SEEN   Squamous Epithelial / LPF 0-5 0 - 5   Mucus PRESENT   Pregnancy, urine POC     Status: Abnormal   Collection Time: 12/11/18 10:58 AM  Result Value Ref Range   Preg Test, Ur POSITIVE (A) NEGATIVE  hCG, quantitative, pregnancy     Status: Abnormal   Collection Time: 12/11/18 11:26 AM  Result Value Ref Range   hCG, Beta Chain, Quant, S 211 (H) <5 mIU/mL  CBC     Status: Abnormal   Collection Time: 12/11/18 11:26 AM  Result Value Ref Range   WBC 3.5 (L) 4.0 - 10.5 K/uL   RBC 4.17 3.87 - 5.11 MIL/uL   Hemoglobin 13.4 12.0 - 15.0 g/dL   HCT 38.5 36.0 - 46.0 %   MCV 92.3 80.0 - 100.0 fL   MCH 32.1 26.0 - 34.0 pg   MCHC 34.8 30.0 - 36.0 g/dL   RDW 11.6 11.5 - 15.5 %   Platelets 281 150 - 400 K/uL   nRBC 0.0 0.0 - 0.2 %  Type and screen     Status: None   Collection Time: 12/11/18 11:30 AM  Result Value Ref Range   ABO/RH(D) O POS    Antibody Screen NEG    Sample Expiration      12/14/2018,2359 Performed at [facility], [address] [place], [place] [address]   Wet prep, genital     Status: Abnormal   Collection Time: 12/11/18 11:43 AM  Result Value Ref  Range   Yeast Wet Prep HPF POC NONE SEEN NONE SEEN   Trich, Wet Prep NONE SEEN NONE SEEN   Clue Cells Wet Prep HPF POC NONE SEEN NONE  SEEN   WBC, Wet Prep HPF POC MODERATE (A) NONE SEEN   Sperm NONE SEEN    [place] Ob Transvaginal  Result Date: 12/11/2018 CLINICAL DATA:  Pregnant patient with bleeding EXAM: TRANSVAGINAL OB ULTRASOUND TECHNIQUE: Transvaginal ultrasound was performed for complete evaluation of the gestation as well as the maternal uterus, adnexal regions, and pelvic cul-de-sac. COMPARISON:  None. FINDINGS: Intrauterine gestational sac: None Maternal uterus/adnexae: The ovaries are normal in appearance with a corpus luteum cyst in the right ovary. No extra uterine pregnancy is visualized. No free fluid in the pelvis. IMPRESSION: No IUP is identified. The lack of an IUP in the setting of a positive beta HCG/pregnancy test may represent a nonvisualized ectopic pregnancy, early pregnancy, or recent miscarriage. Recommend clinical correlation and close follow-up. Electronically Signed   By: Gerome Samavid  Williams III M.D   On: 12/11/2018 12:14    MDM Pelvic Exam with cultures Labs: UA, CBC, T&S, hCG, Wet prep, and GC/CT TVUS Assessment and Plan  28 year old  G1P0 at 5.3 weeks by Definite LMP Vaginal Bleeding  -Exam findings discussed. -Labs collected and sent. -Will await results. -Patient to US for evaluation.  Follow Up (1:10 PM)  -US and lab results return. -In room to discuss with patient. -Patient questions if miscarriage is occurring and informed that provider unsure as more information necessary. -Informed of need for repeat quant in 48 hours to evaluate increase, decrease, or stable levels. -Patient reports plans for travel from Thursday July 9th-Monday July 13th. -Informed that appts available at 0830 on Thursday at [facility] [place] office. -Patient questions risks of waiting stating she does not want to travel while knowingly miscarrying. -Patient informed that miscarriage is a concern,  but ectopic pregnancy also a concern since no IUP identified on US.  Encouraged to receive lab work before trip rather than after. -Patient verbalizes understanding and schedule for Thursday July 9th at 0830 in [facility]. -Bleeding precautions given. -Patient also reassured that if she is in fact miscarrying, it is not from sexual activity and that it is just coincidental that the bleeding started after.   -Encouraged to call or return to MAU if symptoms worsen or with the onset of new symptoms. -Discharged to home in stable condition.  Cherre RobinsJessica L Ewing Fandino MSN, CNM 12/11/2018, 11:31 AM

## 2018-12-11 NOTE — Discharge Instructions (Signed)
Vaginal Bleeding During Pregnancy, First Trimester ° °A small amount of bleeding (spotting) from the vagina is common during early pregnancy. Sometimes the bleeding is normal and does not cause problems. At other times, though, bleeding may be a sign of something serious. Tell your doctor about any bleeding from your vagina right away. °Follow these instructions at home: °Activity °· Follow your doctor's instructions about how active you can be. °· If needed, make plans for someone to help with your normal activities. °· Do not have sex or orgasms until your doctor says that this is safe. °General instructions °· Take over-the-counter and prescription medicines only as told by your doctor. °· Watch your condition for any changes. °· Write down: °? The number of pads you use each day. °? How often you change pads. °? How soaked (saturated) your pads are. °· Do not use tampons. °· Do not douche. °· If you pass any tissue from your vagina, save it to show to your doctor. °· Keep all follow-up visits as told by your doctor. This is important. °Contact a doctor if: °· You have vaginal bleeding at any time while you are pregnant. °· You have cramps. °· You have a fever. °Get help right away if: °· You have very bad cramps in your back or belly (abdomen). °· You pass large clots or a lot of tissue from your vagina. °· Your bleeding gets worse. °· You feel light-headed. °· You feel weak. °· You pass out (faint). °· You have chills. °· You are leaking fluid from your vagina. °· You have a gush of fluid from your vagina. °Summary °· Sometimes vaginal bleeding during pregnancy is normal and does not cause problems. At other times, bleeding may be a sign of something serious. °· Tell your doctor about any bleeding from your vagina right away. °· Follow your doctor's instructions about how active you can be. You may need someone to help you with your normal activities. °This information is not intended to replace advice given to  you by your health care provider. Make sure you discuss any questions you have with your health care provider. °Document Released: 10/07/2013 Document Revised: 09/11/2018 Document Reviewed: 08/24/2016 °Elsevier Patient Education © 2020 [employer] ° °

## 2018-12-13 ENCOUNTER — Other Ambulatory Visit: Payer: Self-pay

## 2018-12-13 ENCOUNTER — Other Ambulatory Visit (INDEPENDENT_AMBULATORY_CARE_PROVIDER_SITE_OTHER): Payer: Managed Care, Other (non HMO)

## 2018-12-13 DIAGNOSIS — O3680X0 Pregnancy with inconclusive fetal viability, not applicable or unspecified: Secondary | ICD-10-CM

## 2018-12-13 LAB — BETA HCG QUANT (REF LAB): hCG Quant: 73 m[IU]/mL

## 2018-12-13 LAB — GC/CHLAMYDIA PROBE AMP (~~LOC~~) NOT AT ARMC
Chlamydia: NEGATIVE
Neisseria Gonorrhea: NEGATIVE

## 2018-12-13 NOTE — Progress Notes (Signed)
Pt here today for STAT Beta Lab. Pt confirms that she is here for pregnancy hormone level in which she feels that she is having miscarriage.   Pt reports that she is having vaginal bleeding that she has to change a pad 3-4 times a day.  She also reports having clots with the bleeding.  Pt c/o pain 4-5 out of 10.  Pt advised that we will call her with results and f/u in about 2 hrs.  Pt verbalized understanding.   Received stat beta result from [facility].  Notified Dr. Hulan Fray pt's sx's and results.  Provider recommended for pt to come in a week for for non stat beta.  Notified pt provider's recommendation.  Pt states that she will be able to come in on 12/20/18 @ 1400 for non stat beta.  Pt informed to continue to monitor for intense pain or vaginal bleeding saturating a pad an hour to please go to MAU.  Pt verbalized understanding.    Mel Almond, RN 12/13/18

## 2018-12-14 NOTE — Progress Notes (Signed)
I have reviewed this chart and agree with the RN/CMA assessment and management.    Mertha Clyatt C Anahla Bevis, MD, FACOG Attending Physician, [facility]  

## 2018-12-17 ENCOUNTER — Other Ambulatory Visit: Payer: Self-pay | Admitting: *Deleted

## 2018-12-17 DIAGNOSIS — O3680X0 Pregnancy with inconclusive fetal viability, not applicable or unspecified: Secondary | ICD-10-CM

## 2018-12-17 NOTE — Progress Notes (Signed)
bhcg

## 2018-12-20 ENCOUNTER — Other Ambulatory Visit: Payer: Managed Care, Other (non HMO)

## 2018-12-20 ENCOUNTER — Other Ambulatory Visit: Payer: Self-pay

## 2018-12-20 DIAGNOSIS — O3680X0 Pregnancy with inconclusive fetal viability, not applicable or unspecified: Secondary | ICD-10-CM

## 2018-12-21 LAB — BETA HCG QUANT (REF LAB): hCG Quant: 4 m[IU]/mL

## 2018-12-24 ENCOUNTER — Telehealth: Payer: Self-pay

## 2018-12-24 NOTE — Telephone Encounter (Signed)
Pt called requesting test results of her beta levels.  Informed pt that her beta levels are a 4 and to wait until she gets her next period before trying again.  Pt asked if she could use tampons or have sex.  I advised pt that she could use tampons and to use protection just so that she can get a normal cycle before trying again.  Pt verbalized understanding.

## 2019-03-13 LAB — OB RESULTS CONSOLE RUBELLA ANTIBODY, IGM: Rubella: IMMUNE

## 2019-03-13 LAB — OB RESULTS CONSOLE HIV ANTIBODY (ROUTINE TESTING): HIV: NONREACTIVE

## 2019-03-13 LAB — OB RESULTS CONSOLE ABO/RH: RH Type: POSITIVE

## 2019-03-13 LAB — OB RESULTS CONSOLE HEPATITIS B SURFACE ANTIGEN: Hepatitis B Surface Ag: NEGATIVE

## 2019-03-13 LAB — OB RESULTS CONSOLE ANTIBODY SCREEN: Antibody Screen: NEGATIVE

## 2019-03-13 LAB — OB RESULTS CONSOLE GC/CHLAMYDIA
Chlamydia: NEGATIVE
Gonorrhea: NEGATIVE

## 2019-03-13 LAB — OB RESULTS CONSOLE RPR: RPR: NONREACTIVE

## 2019-06-07 DIAGNOSIS — O99345 Other mental disorders complicating the puerperium: Secondary | ICD-10-CM

## 2019-06-07 HISTORY — DX: Other mental disorders complicating the puerperium: O99.345

## 2019-06-07 NOTE — L&D Delivery Note (Signed)
Vtx at +3 station and LOA.  Pt desires VE.  I discussed the R&B of VE including but not limited to injury to fetus but the potential benefit of expedited delivery.  She gives her informed consent and wishes to proceed. On the 4th pull delivered viable female apgars 9,9 over 2nd degree ML lac    Placenta delivered spontaneously intact with 3VC. Repair with 2-0 and 3-0 Chromic with good support and hemostasis noted and R/V exam confirms.  PH art was sent..  Mother and baby were doing well.  EBL 100cc  Candice Camp, MD

## 2019-09-23 LAB — OB RESULTS CONSOLE GBS: GBS: POSITIVE

## 2019-10-16 ENCOUNTER — Encounter (HOSPITAL_COMMUNITY): Payer: Self-pay | Admitting: *Deleted

## 2019-10-16 ENCOUNTER — Telehealth (HOSPITAL_COMMUNITY): Payer: Self-pay | Admitting: *Deleted

## 2019-10-16 NOTE — Telephone Encounter (Signed)
Preadmission screen  

## 2019-10-17 ENCOUNTER — Other Ambulatory Visit: Payer: Self-pay

## 2019-10-17 ENCOUNTER — Encounter (HOSPITAL_COMMUNITY): Payer: Self-pay | Admitting: Obstetrics and Gynecology

## 2019-10-17 ENCOUNTER — Inpatient Hospital Stay (HOSPITAL_COMMUNITY): Payer: Managed Care, Other (non HMO) | Admitting: Anesthesiology

## 2019-10-17 ENCOUNTER — Inpatient Hospital Stay (HOSPITAL_COMMUNITY)
Admission: AD | Admit: 2019-10-17 | Discharge: 2019-10-19 | DRG: 807 | Disposition: A | Discharge status: Home / Self Care | Payer: Managed Care, Other (non HMO) | Attending: Obstetrics and Gynecology | Admitting: Obstetrics and Gynecology

## 2019-10-17 DIAGNOSIS — Z3A40 40 weeks gestation of pregnancy: Secondary | ICD-10-CM | POA: Diagnosis not present

## 2019-10-17 DIAGNOSIS — Z20822 Contact with and (suspected) exposure to covid-19: Secondary | ICD-10-CM | POA: Diagnosis present

## 2019-10-17 DIAGNOSIS — O99824 Streptococcus B carrier state complicating childbirth: Secondary | ICD-10-CM | POA: Diagnosis present

## 2019-10-17 DIAGNOSIS — O4292 Full-term premature rupture of membranes, unspecified as to length of time between rupture and onset of labor: Principal | ICD-10-CM | POA: Diagnosis present

## 2019-10-17 LAB — RPR: RPR Ser Ql: NONREACTIVE

## 2019-10-17 LAB — CBC
HCT: 38.1 % (ref 36.0–46.0)
Hemoglobin: 12.8 g/dL (ref 12.0–15.0)
MCH: 31.6 pg (ref 26.0–34.0)
MCHC: 33.6 g/dL (ref 30.0–36.0)
MCV: 94.1 fL (ref 80.0–100.0)
Platelets: 248 10*3/uL (ref 150–400)
RBC: 4.05 MIL/uL (ref 3.87–5.11)
RDW: 13.5 % (ref 11.5–15.5)
WBC: 8.8 10*3/uL (ref 4.0–10.5)
nRBC: 0 % (ref 0.0–0.2)

## 2019-10-17 LAB — SARS CORONAVIRUS 2 (TAT 6-24 HRS): SARS Coronavirus 2: NEGATIVE

## 2019-10-17 LAB — TYPE AND SCREEN
ABO/RH(D): O POS
Antibody Screen: NEGATIVE

## 2019-10-17 LAB — POCT FERN TEST: POCT Fern Test: POSITIVE — AB

## 2019-10-17 MED ORDER — ONDANSETRON HCL 4 MG/2ML IJ SOLN: 4.0000 mg | INTRAMUSCULAR | Status: DC | PRN

## 2019-10-17 MED ORDER — DIPHENHYDRAMINE HCL 50 MG/ML IJ SOLN: 12.5000 mg | INTRAMUSCULAR | Status: DC | PRN

## 2019-10-17 MED ORDER — DIBUCAINE (PERIANAL) 1 % EX OINT: 1.0000 "application " | TOPICAL_OINTMENT | CUTANEOUS | Status: DC | PRN

## 2019-10-17 MED ORDER — COCONUT OIL OIL: 1.0000 "application " | TOPICAL_OIL | Status: DC | PRN

## 2019-10-17 MED ORDER — SOD CITRATE-CITRIC ACID 500-334 MG/5ML PO SOLN: 30.0000 mL | ORAL | Status: DC | PRN

## 2019-10-17 MED ORDER — ZOLPIDEM TARTRATE 5 MG PO TABS: 5.0000 mg | ORAL_TABLET | Freq: Every evening | ORAL | Status: DC | PRN

## 2019-10-17 MED ORDER — TERBUTALINE SULFATE 1 MG/ML IJ SOLN: 0.2500 mg | Freq: Once | INTRAMUSCULAR | Status: DC | PRN

## 2019-10-17 MED ORDER — SODIUM CHLORIDE (PF) 0.9 % IJ SOLN
INTRAMUSCULAR | Status: DC | PRN
  Administered 2019-10-17: 12 mL/h via EPIDURAL

## 2019-10-17 MED ORDER — ACETAMINOPHEN 325 MG PO TABS
650.0000 mg | ORAL_TABLET | ORAL | Status: DC | PRN
  Administered 2019-10-17: 650 mg via ORAL
  Filled 2019-10-17: qty 2

## 2019-10-17 MED ORDER — PENICILLIN G POT IN DEXTROSE 60000 UNIT/ML IV SOLN
3.0000 10*6.[IU] | INTRAVENOUS | Status: DC
  Administered 2019-10-17 (×3): 3 10*6.[IU] via INTRAVENOUS
  Filled 2019-10-17 (×5): qty 50

## 2019-10-17 MED ORDER — DIPHENHYDRAMINE HCL 25 MG PO CAPS: 25.0000 mg | ORAL_CAPSULE | Freq: Four times a day (QID) | ORAL | Status: DC | PRN

## 2019-10-17 MED ORDER — MEASLES, MUMPS & RUBELLA VAC IJ SOLR: 0.5000 mL | Freq: Once | INTRAMUSCULAR | Status: DC

## 2019-10-17 MED ORDER — MISOPROSTOL 50MCG HALF TABLET
50.0000 ug | ORAL_TABLET | ORAL | Status: DC | PRN
  Administered 2019-10-17: 50 ug via ORAL
  Filled 2019-10-17: qty 1

## 2019-10-17 MED ORDER — SODIUM CHLORIDE 0.9 % IV SOLN
5.0000 10*6.[IU] | Freq: Once | INTRAVENOUS | Status: AC
  Administered 2019-10-17: 5 10*6.[IU] via INTRAVENOUS
  Filled 2019-10-17: qty 5

## 2019-10-17 MED ORDER — OXYTOCIN BOLUS FROM INFUSION
500.0000 mL | Freq: Once | INTRAVENOUS | Status: AC
  Administered 2019-10-17: 500 mL via INTRAVENOUS

## 2019-10-17 MED ORDER — OXYTOCIN 40 UNITS IN NORMAL SALINE INFUSION - SIMPLE MED: 2.5000 [IU]/h | INTRAVENOUS | Status: DC

## 2019-10-17 MED ORDER — OXYCODONE-ACETAMINOPHEN 5-325 MG PO TABS: 2.0000 | ORAL_TABLET | ORAL | Status: DC | PRN

## 2019-10-17 MED ORDER — OXYTOCIN 40 UNITS IN NORMAL SALINE INFUSION - SIMPLE MED
1.0000 m[IU]/min | INTRAVENOUS | Status: DC
  Administered 2019-10-17: 2 m[IU]/min via INTRAVENOUS
  Filled 2019-10-17: qty 1000

## 2019-10-17 MED ORDER — BENZOCAINE-MENTHOL 20-0.5 % EX AERO
1.0000 "application " | INHALATION_SPRAY | CUTANEOUS | Status: DC | PRN
  Administered 2019-10-17: 1 via TOPICAL
  Filled 2019-10-17: qty 56

## 2019-10-17 MED ORDER — LIDOCAINE HCL (PF) 1 % IJ SOLN
30.0000 mL | INTRAMUSCULAR | Status: AC | PRN
  Administered 2019-10-17: 5 mL via SUBCUTANEOUS
  Administered 2019-10-17: 2 mL via SUBCUTANEOUS
  Administered 2019-10-17: 3 mL via SUBCUTANEOUS

## 2019-10-17 MED ORDER — WITCH HAZEL-GLYCERIN EX PADS: 1.0000 "application " | MEDICATED_PAD | CUTANEOUS | Status: DC | PRN

## 2019-10-17 MED ORDER — EPHEDRINE 5 MG/ML INJ: 10.0000 mg | INTRAVENOUS | Status: DC | PRN

## 2019-10-17 MED ORDER — PHENYLEPHRINE 40 MCG/ML (10ML) SYRINGE FOR IV PUSH (FOR BLOOD PRESSURE SUPPORT): 80.0000 ug | PREFILLED_SYRINGE | INTRAVENOUS | Status: DC | PRN

## 2019-10-17 MED ORDER — LACTATED RINGERS IV SOLN: INTRAVENOUS | Status: DC

## 2019-10-17 MED ORDER — IBUPROFEN 600 MG PO TABS
600.0000 mg | ORAL_TABLET | Freq: Four times a day (QID) | ORAL | Status: DC
  Administered 2019-10-17 – 2019-10-19 (×7): 600 mg via ORAL
  Filled 2019-10-17 (×7): qty 1

## 2019-10-17 MED ORDER — FLEET ENEMA 7-19 GM/118ML RE ENEM: 1.0000 | ENEMA | RECTAL | Status: DC | PRN

## 2019-10-17 MED ORDER — LACTATED RINGERS IV SOLN: 500.0000 mL | Freq: Once | INTRAVENOUS | Status: DC

## 2019-10-17 MED ORDER — TETANUS-DIPHTH-ACELL PERTUSSIS 5-2.5-18.5 LF-MCG/0.5 IM SUSP: 0.5000 mL | Freq: Once | INTRAMUSCULAR | Status: DC

## 2019-10-17 MED ORDER — OXYCODONE-ACETAMINOPHEN 5-325 MG PO TABS: 1.0000 | ORAL_TABLET | ORAL | Status: DC | PRN

## 2019-10-17 MED ORDER — ONDANSETRON HCL 4 MG PO TABS: 4.0000 mg | ORAL_TABLET | ORAL | Status: DC | PRN

## 2019-10-17 MED ORDER — ONDANSETRON HCL 4 MG/2ML IJ SOLN
4.0000 mg | Freq: Four times a day (QID) | INTRAMUSCULAR | Status: DC | PRN
  Administered 2019-10-17: 4 mg via INTRAVENOUS
  Filled 2019-10-17: qty 2

## 2019-10-17 MED ORDER — MEDROXYPROGESTERONE ACETATE 150 MG/ML IM SUSP: 150.0000 mg | INTRAMUSCULAR | Status: DC | PRN

## 2019-10-17 MED ORDER — SENNOSIDES-DOCUSATE SODIUM 8.6-50 MG PO TABS
2.0000 | ORAL_TABLET | ORAL | Status: DC
  Administered 2019-10-17 – 2019-10-18 (×2): 2 via ORAL
  Filled 2019-10-17 (×2): qty 2

## 2019-10-17 MED ORDER — LACTATED RINGERS IV SOLN: 500.0000 mL | INTRAVENOUS | Status: DC | PRN

## 2019-10-17 MED ORDER — PRENATAL MULTIVITAMIN CH
1.0000 | ORAL_TABLET | Freq: Every day | ORAL | Status: DC
  Administered 2019-10-18 – 2019-10-19 (×2): 1 via ORAL
  Filled 2019-10-17 (×2): qty 1

## 2019-10-17 MED ORDER — SIMETHICONE 80 MG PO CHEW: 80.0000 mg | CHEWABLE_TABLET | ORAL | Status: DC | PRN

## 2019-10-17 MED ORDER — FENTANYL-BUPIVACAINE-NACL 0.5-0.125-0.9 MG/250ML-% EP SOLN
12.0000 mL/h | EPIDURAL | Status: DC | PRN
  Filled 2019-10-17: qty 250

## 2019-10-17 MED ORDER — ACETAMINOPHEN 325 MG PO TABS
650.0000 mg | ORAL_TABLET | ORAL | Status: DC | PRN
  Administered 2019-10-18 (×3): 650 mg via ORAL
  Filled 2019-10-17 (×3): qty 2

## 2019-10-17 NOTE — MAU Note (Signed)
Covid swab obtained without difficulty and pt tol well. No symptoms. Pt has had second covid vaccine over 2 wks.

## 2019-10-17 NOTE — Anesthesia Procedure Notes (Signed)
Epidural Patient location during procedure: OB Start time: 10/17/2019 9:40 AM End time: 10/17/2019 9:46 AM  Staffing Anesthesiologist: Marcene Duos, MD Performed: anesthesiologist   Preanesthetic Checklist Completed: patient identified, IV checked, site marked, risks and benefits discussed, surgical consent, monitors and equipment checked, pre-op evaluation and timeout performed  Epidural Patient position: sitting Prep: DuraPrep and site prepped and draped Patient monitoring: continuous pulse ox and blood pressure Approach: midline Location: L4-L5 Injection technique: LOR air  Needle:  Needle type: Tuohy  Needle gauge: 17 G Needle length: 9 cm and 9 Needle insertion depth: 5 cm cm Catheter type: closed end flexible Catheter size: 19 Gauge Catheter at skin depth: 10 cm Test dose: negative  Assessment Events: blood not aspirated, injection not painful, no injection resistance, no paresthesia and negative IV test

## 2019-10-17 NOTE — H&P (Signed)
Sandra Wilcox is a 29 y.o. female presenting for PROM at [place] and now after 1 cytotec ctxs q 5 ".  Pregnancy uncomplicated. GBS+. OB History    Gravida  2   Para      Term      Preterm      AB  1   Living        SAB  1   TAB      Ectopic      Multiple      Live Births             Past Medical History:  Diagnosis Date  . Allergy   . HSV (herpes simplex virus) anogenital infection   . Ovarian cyst    History reviewed. No pertinent surgical history. Family History: family history includes Cancer in her maternal grandfather and mother. Social History:  reports that she has never smoked. She has never used smokeless tobacco. She reports previous alcohol use of about 6.0 standard drinks of alcohol per week. She reports that she does not use drugs.     Maternal Diabetes: No Genetic Screening: Normal Maternal Ultrasounds/Referrals: Normal Fetal Ultrasounds or other Referrals:  None Maternal Substance Abuse:  No Significant Maternal Medications:  None Significant Maternal Lab Results:  Group B Strep positive Other Comments:  None  Review of Systems History Dilation: 2 Effacement (%): 70 Station: -2 Exam by:: Sandra Hausen RN  Blood pressure 120/78, pulse 90, temperature 99 F (37.2 C), temperature source Oral, resp. rate 18, height 5\' 4"  (1.626 m), weight 80.5 kg, last menstrual period 11/03/2018, unknown if currently breastfeeding. Exam Physical Exam  Prenatal labs: ABO, Rh: --/--/O POS (05/13 0351) Antibody: NEG (05/13 0351) Rubella: Immune (10/07 0000) RPR: Nonreactive (10/07 0000)  HBsAg: Negative (10/07 0000)  HIV: Non-reactive (10/07 0000)  GBS: Positive/-- (04/19 0000)   Assessment/Plan: IUP at term PROM - will augment labor with pitocin. Anticipate SVD   05-23-1994 10/17/2019, 9:05 AM

## 2019-10-17 NOTE — MAU Note (Signed)
Patient reports SROM at 0000 of clear fluid.  Reports some mild cramping, denies VB.  Endorses + FM.

## 2019-10-17 NOTE — Anesthesia Preprocedure Evaluation (Signed)
Anesthesia Evaluation  Patient identified by MRN, date of birth, ID band Patient awake    Reviewed: Allergy & Precautions, Patient's Chart, lab work & pertinent test results  Airway Mallampati: II  TM Distance: >3 FB     Dental   Pulmonary neg pulmonary ROS,    Pulmonary exam normal        Cardiovascular negative cardio ROS Normal cardiovascular exam     Neuro/Psych negative neurological ROS     GI/Hepatic negative GI ROS, Neg liver ROS,   Endo/Other  negative endocrine ROS  Renal/GU negative Renal ROS     Musculoskeletal   Abdominal   Peds  Hematology negative hematology ROS (+)   Anesthesia Other Findings   Reproductive/Obstetrics (+) Pregnancy                             Anesthesia Physical Anesthesia Plan  ASA: II  Anesthesia Plan: Epidural   Post-op Pain Management:    Induction:   PONV Risk Score and Plan: Treatment may vary due to age or medical condition  Airway Management Planned: Natural Airway  Additional Equipment:   Intra-op Plan:   Post-operative Plan:   Informed Consent: I have reviewed the patients History and Physical, chart, labs and discussed the procedure including the risks, benefits and alternatives for the proposed anesthesia with the patient or authorized representative who has indicated his/her understanding and acceptance.       Plan Discussed with:   Anesthesia Plan Comments:         Anesthesia Quick Evaluation  

## 2019-10-18 LAB — CBC
HCT: 31.4 % — ABNORMAL LOW (ref 36.0–46.0)
Hemoglobin: 10.5 g/dL — ABNORMAL LOW (ref 12.0–15.0)
MCH: 32.2 pg (ref 26.0–34.0)
MCHC: 33.4 g/dL (ref 30.0–36.0)
MCV: 96.3 fL (ref 80.0–100.0)
Platelets: 208 10*3/uL (ref 150–400)
RBC: 3.26 MIL/uL — ABNORMAL LOW (ref 3.87–5.11)
RDW: 13.7 % (ref 11.5–15.5)
WBC: 13 10*3/uL — ABNORMAL HIGH (ref 4.0–10.5)
nRBC: 0 % (ref 0.0–0.2)

## 2019-10-18 NOTE — Lactation Note (Signed)
This note was copied from a baby's chart. Lactation Consultation Note Baby 7 hrs old. Mom stated baby has latched well a couple of times. Mom trying to latch baby when LC entered rm. Discussed positioning, props, support, and safety. Newborn behavior, STS, I&O, breast massage, milk storage, supply and demand. Mom encouraged to feed baby 8-12 times/24 hours and with feeding cues.   LC assisted in football position.  Mom has tubular breast w/compressible everted nipples. Hand expression demonstrated w/colostrum noted. Baby latched well. Baby has a small mouth. Encouraged lip flanged top and bottom, and chin tug to widen latch. Heard a few swallows.   Answered questions. Encouraged to call for assistance. Lactation brochure given.  Patient Name: Sandra Wilcox [id number] Date: 10/18/2019 Reason for consult: Initial assessment;Primapara;Term   Maternal Data Has patient been taught Hand Expression?: Yes Does the patient have breastfeeding experience prior to this delivery?: No  Feeding Feeding Type: Breast Fed  LATCH Score Latch: Grasps breast easily, tongue down, lips flanged, rhythmical sucking.  Audible Swallowing: A few with stimulation  Type of Nipple: Everted at rest and after stimulation  Comfort (Breast/Nipple): Soft / non-tender  Hold (Positioning): Assistance needed to correctly position infant at breast and maintain latch.  LATCH Score: 7  Interventions Interventions: Breast feeding basics reviewed;Support pillows;Assisted with latch;Position options;Skin to skin;Breast massage;Hand express;Breast compression;Adjust position  Lactation Tools Discussed/Used WIC Program: No   Consult Status Consult Status: Follow-up Date: 10/18/19(in pm) Follow-up type: In-patient    Esvin Hnat, Diamond Nickel 10/18/2019, 3:15 AM

## 2019-10-18 NOTE — Anesthesia Postprocedure Evaluation (Signed)
Anesthesia Post Note  Patient: Sandra Wilcox  Procedure(s) Performed: AN AD HOC LABOR EPIDURAL     Patient location during evaluation: Mother Baby Anesthesia Type: Epidural Level of consciousness: awake Pain management: satisfactory to patient Vital Signs Assessment: post-procedure vital signs reviewed and stable Respiratory status: spontaneous breathing Cardiovascular status: stable Anesthetic complications: no    Last Vitals:  Vitals:   10/17/19 2257 10/18/19 0355  BP: 114/76 115/77  Pulse: 75 74  Resp: 16 18  Temp: 36.7 C 36.7 C  SpO2: 98% 98%    Last Pain:  Vitals:   10/18/19 0534  TempSrc:   PainSc: 2    Pain Goal:                   [name]

## 2019-10-18 NOTE — Progress Notes (Signed)
Post Partum Day 1 Subjective: no complaints, up ad lib, voiding, tolerating PO and + flatus  Objective: Blood pressure 115/77, pulse 74, temperature 98.1 F (36.7 C), temperature source Oral, resp. rate 18, height 5\' 4"  (1.626 m), weight 80.5 kg, last menstrual period 11/03/2018, SpO2 98 %, unknown if currently breastfeeding.  Physical Exam:  General: alert, cooperative and no distress Lochia: appropriate Uterine Fundus: firm Incision: healing well DVT Evaluation: No evidence of DVT seen on physical exam.  Recent Labs    10/17/19 0351 10/18/19 0428  HGB 12.8 10.5*  HCT 38.1 31.4*    Assessment/Plan: Plan for discharge tomorrow  D/W circumcision of newborn boy. Risks reviewed. Patient states she understands and agrees.   LOS: 1 day   10/20/19 II 10/18/2019, 7:49 AM

## 2019-10-18 NOTE — Lactation Note (Signed)
This note was copied from a baby's chart. Lactation Consultation Note  Patient Name: Sandra Wilcox [id number] Date: 10/18/2019 Reason for consult: Follow-up assessment    P1 mother whose infant is now 24 hours old.  This is a term baby at 40+0 weeks.  Baby was swaddled and asleep in the bassinet when I arrived.  Mother was attempting to rest with a sleep mask over her eyes.  Mother stated that she feels like baby has latched well since delivery.  She has been taught hand expression and is able to express colostrum drops.  Encouraged feeding 8-12 times/24 hours or sooner if baby shows feeding cues.  Mother will call for assistance as needed.  Mother has a DEBP for home use.  Father present.  Parents interested in resting and I offered to place a "quiet sign" on their door.  RN updated.     Consult Status Consult Status: Follow-up Date: 10/19/19 Follow-up type: In-patient    Sandra Wilcox R [name] 10/18/2019, 1:44 PM

## 2019-10-18 NOTE — Lactation Note (Signed)
This note was copied from a baby's chart. Lactation Consultation Note  Patient Name: Sandra Wilcox Today's Date: 10/18/2019  P1, 25 hour female term infant. LC was to entered room, sign on door "please  do not disturb".    Maternal Data    Feeding Feeding Type: Breast Fed  LATCH Score                   Interventions    Lactation Tools Discussed/Used     Consult Status      Danelle Earthly 10/18/2019, 9:23 PM

## 2019-10-19 ENCOUNTER — Other Ambulatory Visit (HOSPITAL_COMMUNITY): Payer: Managed Care, Other (non HMO)

## 2019-10-19 MED ORDER — IBUPROFEN 600 MG PO TABS: 600.0000 mg | ORAL_TABLET | Freq: Four times a day (QID) | ORAL | 0 refills | Status: DC | PRN

## 2019-10-19 MED ORDER — ACETAMINOPHEN 325 MG PO TABS: 650.0000 mg | ORAL_TABLET | Freq: Four times a day (QID) | ORAL | 0 refills | Status: DC | PRN

## 2019-10-19 NOTE — Discharge Summary (Signed)
Postpartum Discharge Summary  Date of Service updated 10/19/19     Patient Name: Sandra Wilcox DOB: 04/27/1991 MRN: [id number]  Date of admission: 10/17/2019 Delivery date:10/17/2019  Delivering provider: Louretta Shorten  Date of discharge: 10/19/2019  Admitting diagnosis: Indication for care in labor or delivery [O75.9] Intrauterine pregnancy: [redacted]w[redacted]d    Secondary diagnosis:  Active Problems:   Indication for care in labor or delivery  Additional problems: none    Discharge diagnosis: Term Pregnancy Delivered                                              Post partum procedures:none Augmentation: Pitocin Complications: None  Hospital course: Onset of Labor With Vaginal Delivery      29y.o. yo G2P1011 at 481w0das admitted in Latent Labor on 10/17/2019. Patient had an uncomplicated labor course as follows:  Membrane Rupture Time/Date: 12:00 AM ,10/17/2019   Delivery Method:Vaginal, Vacuum (Extractor)  Episiotomy: None  Lacerations:  2nd degree  Patient had an uncomplicated postpartum course.  She is ambulating, tolerating a regular diet, passing flatus, and urinating well. Patient is discharged home in stable condition on 10/19/19.  Newborn Data: Birth date:10/17/2019  Birth time:7:27 PM  Gender:Female  Living status:Living  Apgars:8 ,9   Magnesium Sulfate received: No BMZ received: No Rhophylac:No MMR:No T-DaP:Given prenatally Flu: No Transfusion:No  Physical exam  Vitals:   10/18/19 0754 10/18/19 1130 10/18/19 2200 10/19/19 0615  BP: 107/66 114/60 139/90 120/82  Pulse: 76 77 80 72  Resp: _0 Temp: 98.4 F (36.9 C) 98.5 F (36.9 C) 98.7 F (37.1 C) 97.9 F (36.6 C)  TempSrc: Oral Oral Oral Oral  SpO2: 98% 98%  99%  Weight:      Height:       General: alert, cooperative and no distress Lochia: appropriate Uterine Fundus: firm Incision: Healing well with no significant drainage DVT Evaluation: No evidence of DVT seen on physical exam. Labs: Lab  Results  Component Value Date   WBC 13.0 (H) 10/18/2019   HGB 10.5 (L) 10/18/2019   HCT 31.4 (L) 10/18/2019   MCV 96.3 10/18/2019   PLT 208 10/18/2019   CMP Latest Ref Rng & Units 11/15/2015  Glucose 65 - 99 mg/dL 85  BUN 6 - 20 mg/dL 8  Creatinine 0.44 - 1.00 mg/dL 0.56  Sodium 135 - 145 mmol/L 139  Potassium 3.5 - 5.1 mmol/L 3.9  Chloride 101 - 111 mmol/L 108  CO2 22 - 32 mmol/L 25  Calcium 8.9 - 10.3 mg/dL 9.0  Total Protein 6.5 - 8.1 g/dL 7.3  Total Bilirubin 0.3 - 1.2 mg/dL 0.8  Alkaline Phos 38 - 126 U/L 40  AST 15 - 41 U/L 16  ALT 14 - 54 U/L 13(L)   Edinburgh Score: No flowsheet data found.    After visit meds:  Allergies as of 10/19/2019      Reactions   Shellfish Allergy Swelling   Okay to take all other shellfish, allergic to muscles- cause swelling of face, lips, chin      Medication List    STOP taking these medications   alum & mag hydroxide-simeth 200-200-20 MG/5ML suspension Commonly known as: MAALOX/MYLANTA   Diclegis 10-10 MG Tbec Generic drug: Doxylamine-Pyridoxine   famotidine 40 MG tablet Commonly known as: PEPCID   omeprazole 20 MG capsule Commonly  known as: PRILOSEC   ondansetron 4 MG tablet Commonly known as: ZOFRAN   valACYclovir 500 MG tablet Commonly known as: VALTREX     TAKE these medications   acetaminophen 325 MG tablet Commonly known as: Tylenol Take 2 tablets (650 mg total) by mouth every 6 (six) hours as needed (for pain scale < 4).   ibuprofen 600 MG tablet Commonly known as: ADVIL Take 1 tablet (600 mg total) by mouth every 6 (six) hours as needed.   montelukast 10 MG tablet Commonly known as: SINGULAIR Take 10 mg by mouth at bedtime.   polyvinyl alcohol 1.4 % ophthalmic solution Commonly known as: LIQUIFILM TEARS Place 1 drop into both eyes as needed for dry eyes.   prenatal multivitamin Tabs tablet Take 1 tablet by mouth daily at 12 noon.        Discharge home in stable condition Infant Feeding:  Breast Infant Disposition:home with mother Discharge instruction: per After Visit Summary and Postpartum booklet. Activity: Advance as tolerated. Pelvic rest for 6 weeks.  Diet: routine diet Anticipated Birth Control: Unsure Postpartum Appointment:6 weeks Additional Postpartum F/U: None Future Appointments:No future appointments. Follow up Visit:      10/19/2019 Allena Katz, MD

## 2019-10-19 NOTE — Lactation Note (Signed)
This note was copied from a baby's chart. Lactation Consultation Note  Patient Name: Sandra Wilcox Date: 10/19/2019 Reason for consult: Follow-up assessment Baby is 37 hours old/7% weight loss.  Baby is currently feeding in cross cradle hold.  Mom reports that baby cluster fed last night.  Discussed milk coming to volume and the prevention and treatment of engorgement.  Mom has a breast pump at home.  No questions or concerns.  Reviewed outpatient services and encouraged to call prn.  Maternal Data    Feeding Feeding Type: Breast Fed  LATCH Score                   Interventions    Lactation Tools Discussed/Used     Consult Status Consult Status: Complete Follow-up type: Call as needed    Huston Foley 10/19/2019, 9:11 AM

## 2019-10-20 ENCOUNTER — Ambulatory Visit: Payer: Self-pay

## 2019-10-20 NOTE — Lactation Note (Signed)
This note was copied from a baby's chart. Lactation Consultation Note  Patient Name: Sandra Wilcox DODQV'H Date: 10/20/2019 Reason for consult: Follow-up assessment;Hyperbilirubinemia;Infant weight loss Baby is 61 hours old/9% weight loss.  Baby just finished a 40 minute feeding.  Baby is receiving phototherapy.  Bilirubin level increased this morning.  Discussed supplementation with mom.  Mom feels breasts are filling.  Symphony pump set up and initiated.  Discussed using donor milk or formula if more volume needed.  Mom pumped 4 mls of colostrum which was finger fed to baby.  Mom chooses formula for additional supplementation.  10 mls of formula given by curved tip syringe.  Reviewed plan with mom.  Encouraged to call prn.  Maternal Data    Feeding Feeding Type: Breast Fed  LATCH Score Latch: Too sleepy or reluctant, no latch achieved, no sucking elicited.  Audible Swallowing: None  Type of Nipple: Everted at rest and after stimulation  Comfort (Breast/Nipple): Soft / non-tender  Hold (Positioning): Assistance needed to correctly position infant at breast and maintain latch.  LATCH Score: 5  Interventions    Lactation Tools Discussed/Used Pump Review: Setup, frequency, and cleaning;Milk Storage Initiated by:: [email or web address] Date initiated:: 10/20/19   Consult Status Consult Status: Follow-up Date: 10/21/19 Follow-up type: In-patient    Sandra Wilcox 10/20/2019, 8:52 AM

## 2019-10-21 ENCOUNTER — Inpatient Hospital Stay (HOSPITAL_COMMUNITY)
Admission: AD | Admit: 2019-10-21 | Payer: Managed Care, Other (non HMO) | Source: Home / Self Care | Admitting: Obstetrics and Gynecology

## 2019-10-21 ENCOUNTER — Ambulatory Visit: Payer: Self-pay

## 2019-10-21 ENCOUNTER — Inpatient Hospital Stay (HOSPITAL_COMMUNITY): Payer: Managed Care, Other (non HMO)

## 2019-10-21 NOTE — Lactation Note (Signed)
This note was copied from a baby's chart. Lactation Consultation Note  Patient Name: Sandra Wilcox BBUYZ'[id number] Date: 10/21/2019 Reason for consult: Follow-up assessment  P1 mother whose infant is now 47 hours old.  This is a term baby at 40+0 weeks.  Baby had a 10% weight loss yesterday and is currently a 7% weight loss.  He remains under phototherapy being treated with a bili blanket.  His bilirubin level was 12.8 mg/dl at 59 hours and is currently at 11.9 at 82 hours of life.  Mother had just finished breast feeding him and stated that his feedings "have gotten much better."  She has been supplementing with formula after every feeding.  Mother has been breast feeding at least every three hours and supplementing with 15-35 mls after feedings.    Encouraged mother to be sure to continue to feed 8-12 times/24 hours or with feeding cues.  She will awaken him every three hours to help minimize further weight loss and keep the bilirubin levels within acceptable limits.  Suggested mother continue hand expression before/after feedings and to post pump for 15 minutes after feedings.  Mother has been able to obtain approximately 15 mls of EBM when pumping.  Engorgement prevention/treatment reviewed.  Changed flange size from a #24 to a #27 for better fit.  Manual pump demonstrated.  Mother has a DEBP for home use.  She also has our OP phone number for questions/concerns after discharge.  She will continue to make feeding her primary goal and will most likely be seen for the first pediatric visit tomorrow.  Praised parents for their continued hard work in feeding and caring for their baby.  Mother will call for further questions.  Family is excited for a discharge today.   Maternal Data    Feeding Feeding Type: Breast Fed Nipple Type: Regular  LATCH Score                   Interventions    Lactation Tools Discussed/Used     Consult Status Consult Status: Complete Date:  10/21/19 Follow-up type: Call as needed    Warner Laduca R Kamarri Lovvorn 10/21/2019, 8:23 AM

## 2021-03-02 IMAGING — US TRANSVAGINAL OB ULTRASOUND
1 series · 15 of 28 positions shown · non-contrast
Comparison: None.

CLINICAL DATA: Pregnant patient with bleeding

EXAM:
TRANSVAGINAL OB ULTRASOUND
TECHNIQUE: Transvaginal ultrasound was performed for complete evaluation of the
gestation as well as the maternal uterus, adnexal regions, and
pelvic cul-de-sac.

[Series 1: transvaginal ob ultrasound · 15 of 39 slices shown]
[im 1/39]
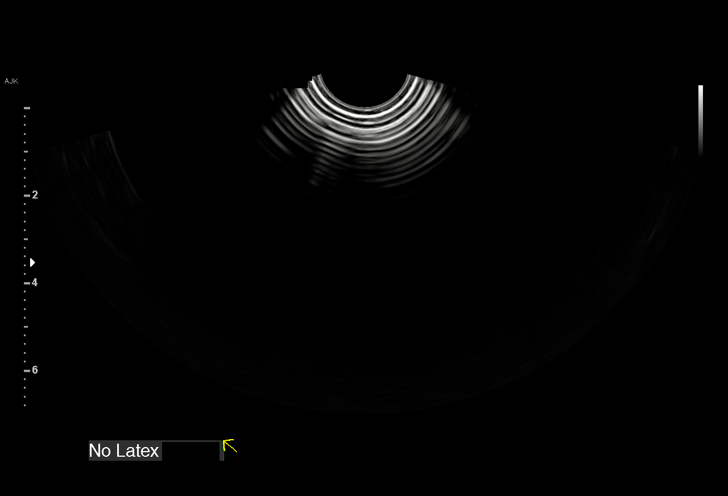
[im 3/39]
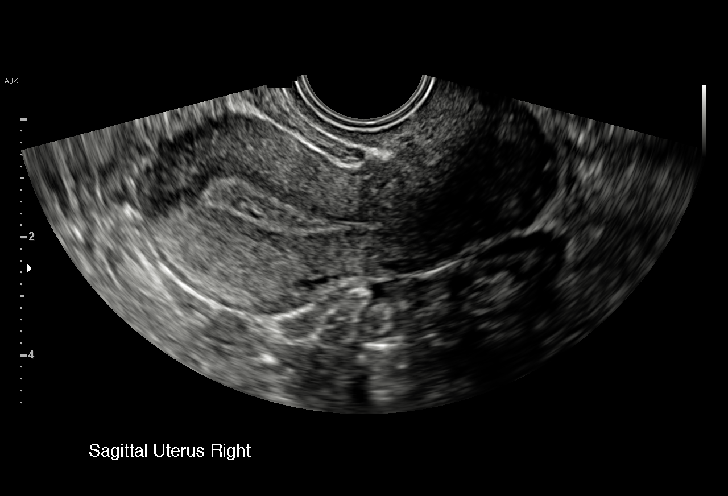
[im 6/39]
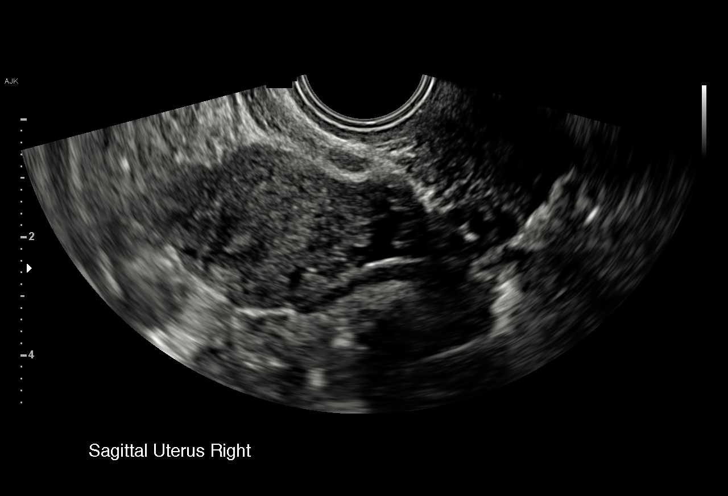
[im 9/39]
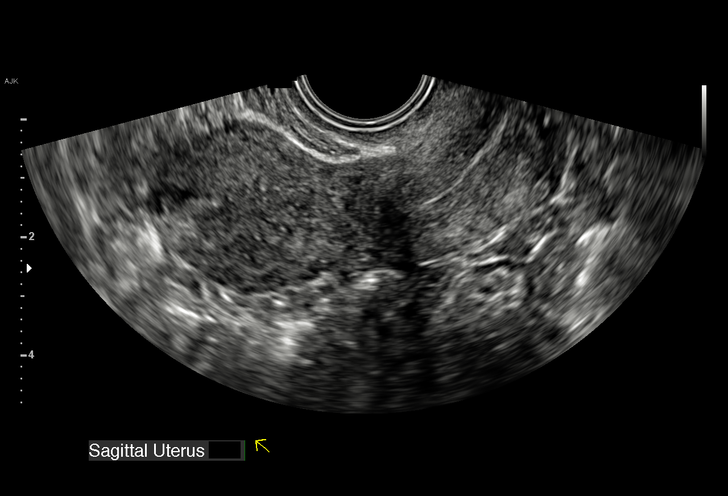
[im 12/39]
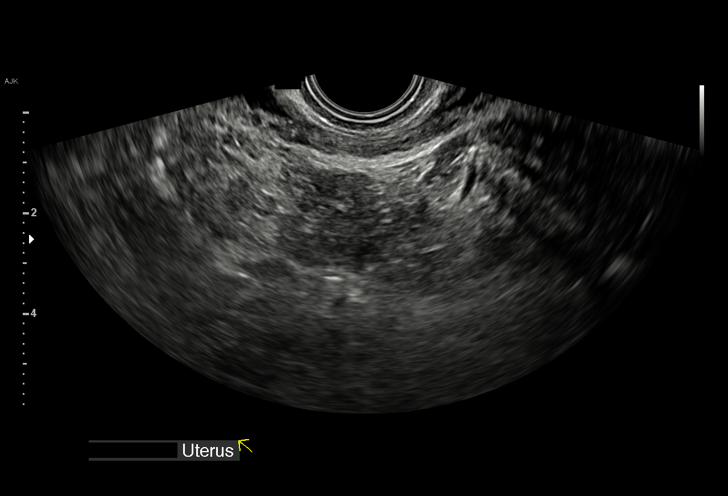
[im 15/39]
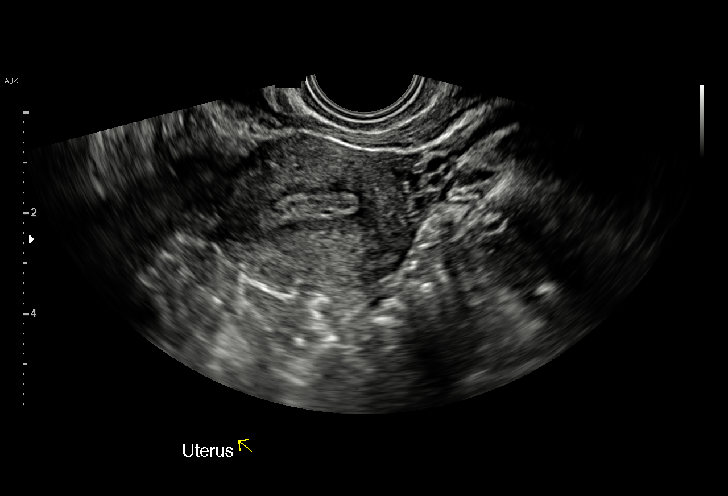
[im 17/39]
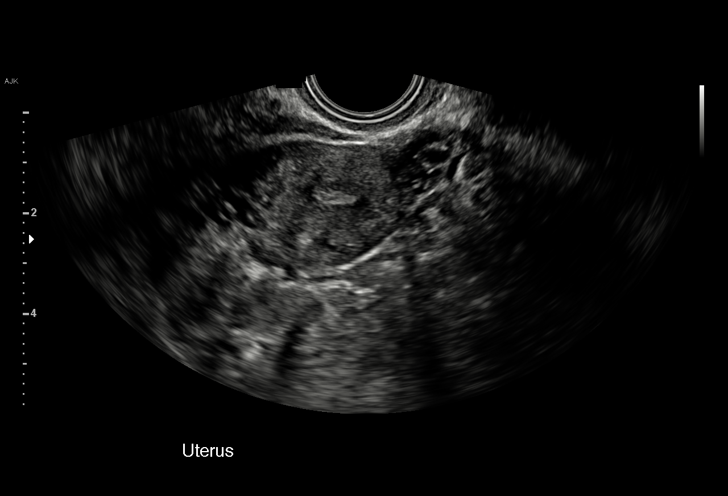
[im 20/39]
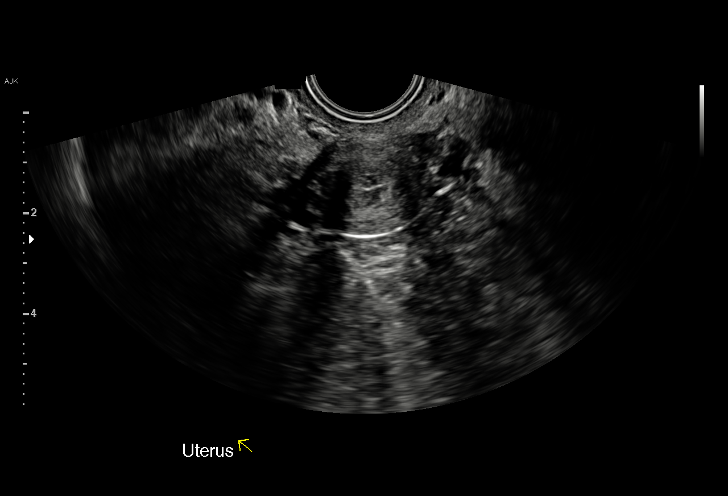
[im 22/39]
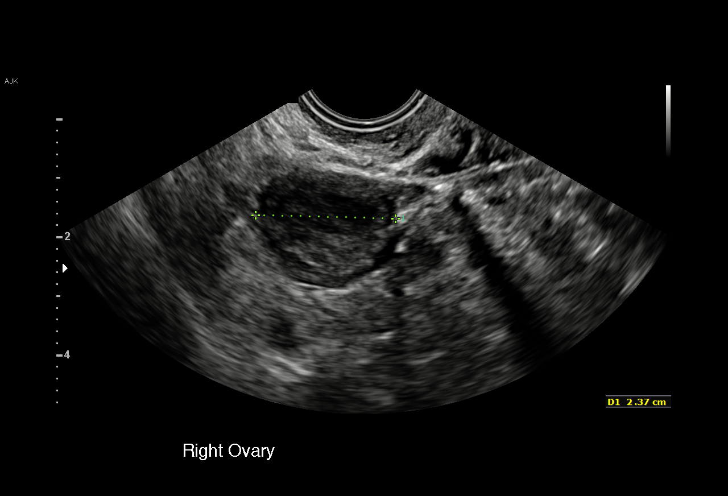
[im 24/39]
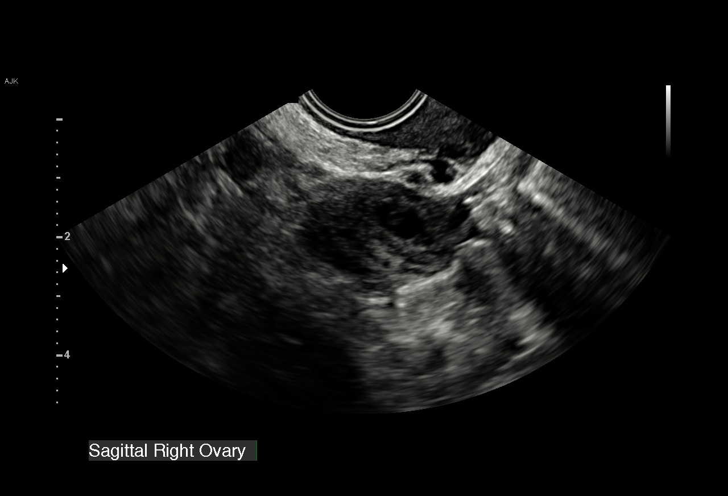
[im 27/39]
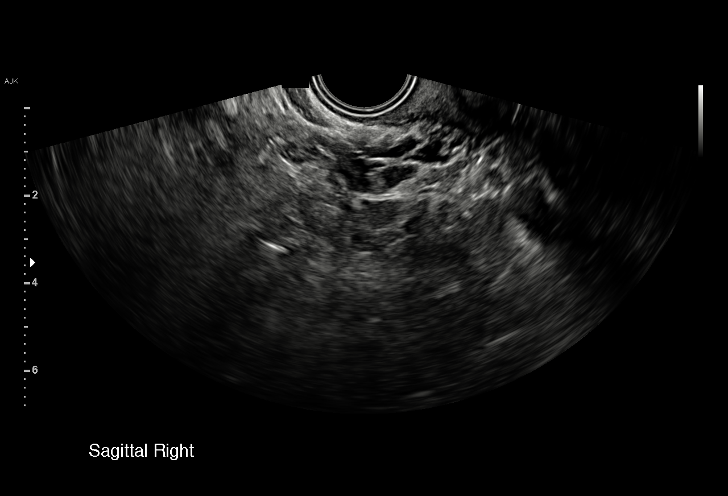
[im 30/39]
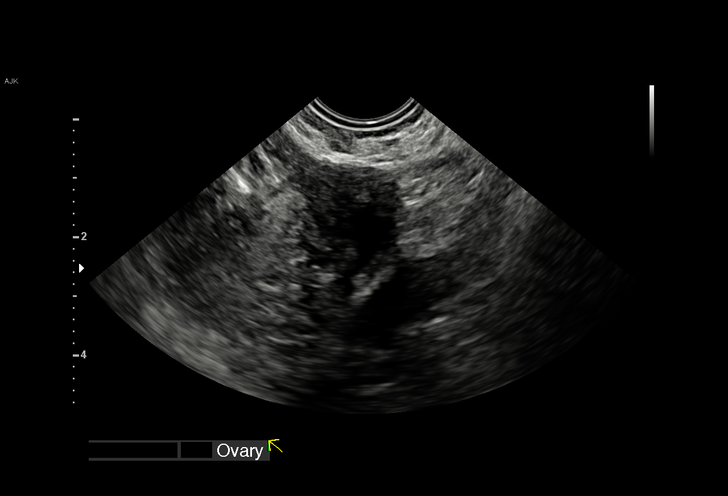
[im 33/39]
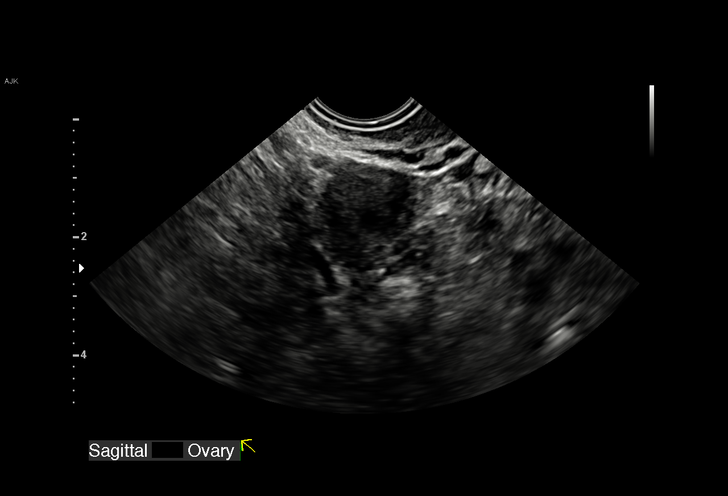
[im 36/39]
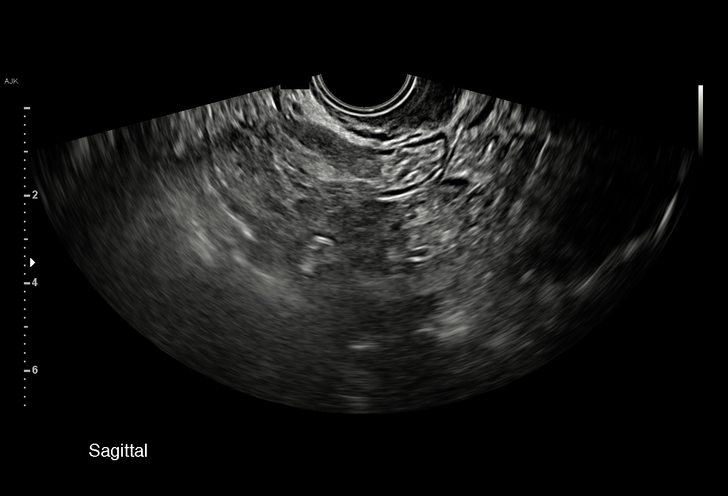
[im 39/39]
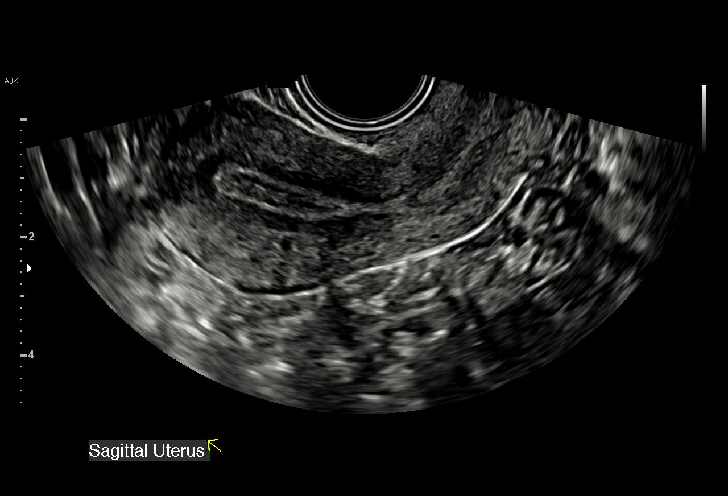

[15 of 28 positions shown; findings below may reference images not displayed]

FINDINGS: Intrauterine gestational sac: None

Maternal uterus/adnexae: The ovaries are normal in appearance with a
corpus luteum cyst in the right ovary. No extra uterine pregnancy is
visualized. No free fluid in the pelvis.
IMPRESSION: No IUP is identified. The lack of an IUP in the setting of a
positive beta HCG/pregnancy test may represent a nonvisualized
ectopic pregnancy, early pregnancy, or recent miscarriage. Recommend
clinical correlation and close follow-up.

## 2023-05-26 NOTE — Progress Notes (Signed)
 Subjective   Patient ID:  Sandra Wilcox is a 32 y.o. (DOB 15-Nov-1990) female.   Chief Complaint  Patient presents with  . Back Pain    Off and on since childbirth, has been progressively getting worse      HPI  Pt presents c/o lower back pain x several years. Started when she was pregnant and has continued. Cannot make it through the day without putting heat on the ara. Prolonged standing is miserable. Pain is along the lower back on both sides. Pain is intermittent, but worse with certain movements. Described as dull and achy, but sharper with leaning forward with weight. No radiation of pain. No paresthesias, weakness or problems with urination. Pain is worse during menses - takes midol  or ibuprofen  prn. Denies any fever/chills. No n/v/d. Feeling well otherwise.  Problem List, Past Medical History, Past Surgical History, Past Family History, Social History, Medications, and Allergies, were reviewed and updated as appropriate.   Review of Systems Review of Systems  Constitutional:  Negative for chills, fatigue and fever.  Respiratory:  Negative for cough, chest tightness and shortness of breath.   Cardiovascular:  Negative for chest pain, palpitations and leg swelling.  Gastrointestinal:  Negative for abdominal pain, diarrhea, nausea and vomiting.  Genitourinary:  Negative for difficulty urinating, dysuria and flank pain.  Musculoskeletal:  Positive for back pain. Negative for gait problem.  Skin:  Negative for rash.  Neurological:  Negative for dizziness, syncope, weakness, light-headedness, numbness and headaches.     Objective   BP 118/84 (BP Location: Right Upper Arm, Patient Position: Sitting)   Pulse 97   Temp (!) 96 F (35.6 C) (Temporal)   Resp 18   Ht 5' 5 (1.651 m)   Wt 144 lb 6.4 oz (65.5 kg)   LMP 05/14/2023 (Exact Date)   SpO2 98%   Breastfeeding No   BMI 24.03 kg/m   Physical Exam Vitals and nursing note reviewed.  Constitutional:       General: She is not in acute distress.    Appearance: Normal appearance. She is well-developed. She is not ill-appearing or toxic-appearing.  HENT:     Head: Normocephalic and atraumatic.  Cardiovascular:     Rate and Rhythm: Normal rate and regular rhythm.     Heart sounds: Normal heart sounds, S1 normal and S2 normal. No murmur heard.    No friction rub. No gallop.  Musculoskeletal:     Cervical back: Neck supple.     Lumbar back: Tenderness present. No swelling, deformity, spasms or bony tenderness. Decreased range of motion (minimally with full extension and lateral bending.). Negative right straight leg raise test and negative left straight leg raise test.  Pulmonary:     Effort: Pulmonary effort is normal.     Breath sounds: Normal breath sounds. No decreased breath sounds, wheezing, rhonchi or rales.  Skin:    General: Skin is warm and dry.     Findings: No rash.  Neurological:     General: No focal deficit present.     Mental Status: She is alert and oriented to person, place, and time.     Sensory: Sensation is intact. No sensory deficit.     Motor: No weakness.     Gait: Gait is intact.     Deep Tendon Reflexes:     Reflex Scores:      Patellar reflexes are 2+ on the right side and 2+ on the left side.      Achilles reflexes are  2+ on the right side and 2+ on the left side. Psychiatric:        Mood and Affect: Mood normal.        Behavior: Behavior normal.        Thought Content: Thought content normal.        Judgment: Judgment normal.     --------------------------- Labs last 12 hours Recent Results (from the past 12 hour(s))  POCT urinalysis dipstick   Collection Time: 05/26/23  4:18 PM  Result Value Ref Range   Color Yellow Yellow, Straw, Dark yellow, Colorless, Bright yellow, Xantho, Comment, Pale Yellow   Clarity Slightly cloudy (A) Clear, Slightly hazy   Glucose,Urine Negative Negative mg/dL   Bilirubin Negative Negative   Ketones,Urine Trace (A) Negative  mg/dL   Specific Gravity 8.979 1.005 - 1.030   Blood, Urine Negative Negative   pH 6.0 5.0 - 9.0   Protein Negative Negative mg/dL   Urobilinogen 0.2 0.2, 1.0 EU/dL   Nitrite Negative Negative   Leukocyte Esterase Negative Negative      Impression   1. Chronic bilateral low back pain without sciatica  POCT urinalysis dipstick   XR Spine  Lumbar 2-3 Views       Plan  Reassurance. Discussed with pt that I agree that she shouldn't be having this significant back pain at her age that is affecting her daily life. Warm compresses. Avoid activity that causes pain such as heavy lifting. Massage. Gentle stretches/ROM exercises. Increase fluids. Will check x-rays. Discussed that we also might refer her to PT and/or spine specialist depending on results. Ok to continue motrin  prn. Offered a Rx NSAID, but pt declines.  See encounter after visit summary for patient specific instructions.  I have reviewed the information contained in this note and personally verified its accuracy.  I obtained the history of present illness and personally performed the physical exam.  Patient verbalized to me that they understood what their problem is, what they need to do about it and why it is important that they do it.  Follow up in about 3 months (around 08/24/2023) for CPE - fasting.    Orders Placed This Encounter  Procedures  . XR Spine  Lumbar 2-3 Views  . POCT urinalysis dipstick     Patient's Medications       * Accurate as of May 26, 2023  5:55 PM. Reflects encounter med changes as of last refresh          Continued Medications      Instructions  famotidine 40 mg tablet Commonly known as: PEPCID  TAKE 1 TABLET BY MOUTH EVERY DAY IN THE EVENING   hydrOXYzine pamoate 25 mg capsule Commonly known as: VISTARIL  25 mg, Oral, At bedtime as needed   levocetirizine 5 MG tablet Commonly known as: XYZAL  SMARTSIG:1 Tablet(s) By Mouth Every Evening   montelukast 10 MG  tablet Commonly known as: SINGULAIR  TAKE 1 TABLET BY MOUTH EVERYDAY AT BEDTIME   ondansetron  4 mg tablet Commonly known as: ZOFRAN   4 mg, Oral, Every 8 hours as needed   valACYclovir  500 mg tablet Commonly known as: VALTREX   Take 1 tablet by mouth twice daily for five days.       Modified Medications      Instructions  albuterol sulfate HFA 108 (90 Base) MCG/ACT inhaler Commonly known as: PROVENTIL,VENTOLIN,PROAIR What changed: Another medication with the same name was removed. Continue taking this medication, and follow the directions you see here. Changed by:  Sheri Beal  2 puffs, Inhalation, Every 4 hours as needed respiratory       Discontinued Medications    ALVESCO 160 MCG/ACT inhaler Generic drug: ciclesonide Stopped by: Tylene Meres   JUNEL FE 1/20 1-20 MG-MCG per tablet Generic drug: norethindrone-ethinyl estradiol-ferrous fumarate Stopped by: Tylene Meres   sertraline  100 mg tablet Commonly known as: ZOLOFT  Stopped by: Tylene Meres        Risks, benefits, and alternatives of the medications and treatment plan prescribed today were discussed, and patient expressed understanding. Plan follow-up as discussed or as needed if any worsening symptoms or change in condition.    A yearly health maintenance exam was recommended where appropriate.     *Some images could not be shown.

## 2023-06-07 NOTE — L&D Delivery Note (Signed)
 Delivery Note At 8:11 PM a viable female was delivered via Vaginal, Spontaneous (Presentation: LOA).  APGAR: 8, 9; weight pending.   Placenta status: Spontaneous, Intact.  Cord: 3 vessels with the following complications: None.  Cord pH: n/a  Anesthesia: Epidural Episiotomy: None Lacerations: Vaginal Suture Repair: 3.0 vicryl rapide Est. Blood Loss (mL):  200  Mom to postpartum.  Baby to Couplet care / Skin to Skin.  Duwaine Blumenthal 02/24/2024, 8:30 PM

## 2023-06-30 ENCOUNTER — Ambulatory Visit: Payer: Managed Care, Other (non HMO) | Attending: Physician Assistant

## 2023-06-30 DIAGNOSIS — M5459 Other low back pain: Secondary | ICD-10-CM | POA: Diagnosis present

## 2023-06-30 DIAGNOSIS — M533 Sacrococcygeal disorders, not elsewhere classified: Secondary | ICD-10-CM | POA: Insufficient documentation

## 2023-06-30 DIAGNOSIS — M6281 Muscle weakness (generalized): Secondary | ICD-10-CM | POA: Diagnosis present

## 2023-06-30 DIAGNOSIS — M5417 Radiculopathy, lumbosacral region: Secondary | ICD-10-CM | POA: Diagnosis present

## 2023-06-30 NOTE — Therapy (Signed)
OUTPATIENT PHYSICAL THERAPY THORACOLUMBAR EVALUATION   Patient Name: Sandra Wilcox MRN: [id number] DOB:1991/03/01, 33 y.o., female Today's Date: 06/30/2023  END OF SESSION:  PT End of Session - 06/30/23 0837     Visit Number 1    Date for PT Re-Evaluation 08/25/23    PT Start Time 0840    PT Stop Time 0925    PT Time Calculation (min) 45 min    Activity Tolerance Patient tolerated treatment well    Behavior During Therapy [facility] for tasks assessed/performed             Past Medical History:  Diagnosis Date   Allergy    HSV (herpes simplex virus) anogenital infection    Ovarian cyst    History reviewed. No pertinent surgical history. Patient Active Problem List   Diagnosis Date Noted   Indication for care in labor or delivery 10/17/2019   Allergic rhinitis 10/11/2012    PCP: Dr. Ladora Daniel  REFERRING PROVIDER: Dr. 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 Back started hurting with pregnancy. X-ray primary referred here. No specific incidence to begin back pain, came on over time.   PERTINENT HISTORY:  No PSH  PAIN:  Are you having pain? Yes: NPRS scale: 6/10 (end of day) Pain location: bilateral low back Pain description: dull achy, sharp with flexion Aggravating factors: movement, prolonged standing Relieving factors: heat, yoga, elevation, deep pressure, massage gun   PRECAUTIONS: None  RED FLAGS: None   WEIGHT BEARING RESTRICTIONS: No  FALLS:  Has  patient fallen in last 6 months? No  LIVING ENVIRONMENT: Lives with: lives with their family Lives in: House/apartment Stairs: internal - 1 flight, external - few steps Has following equipment at home: None  OCCUPATION: sign language interpreter, part time  PLOF: Independent and Independent with basic ADLs  PATIENT GOALS: find root cause, strengthening, work on core strength, pain relief  NEXT MD VISIT: none scheduled  OBJECTIVE:  Note: Objective measures were completed at Evaluation unless otherwise noted.  DIAGNOSTIC FINDINGS:  X-ray: no significant degenerative findings  PATIENT SURVEYS:  Modified Oswestry 16/50 - 32%   COGNITION: Overall cognitive  status: Within functional limits for tasks assessed     SENSATION: WFL, hands sometimes go numb  POSTURE: No Significant postural limitations  PALPATION: SI joint - tender with manual pressure  LUMBAR ROM:   AROM eval  Flexion WFL*  Extension 75%*   Right lateral flexion WFL  Left lateral flexion WFL  Right rotation WFL  Left rotation WFL   (Blank rows = not tested)  LOWER EXTREMITY ROM:     Active  Right eval Left eval  Hip flexion [facility]  Hip extension    Hip abduction    Hip adduction    Hip internal rotation    Hip external rotation    Knee flexion    Knee extension    Ankle dorsiflexion    Ankle plantarflexion    Ankle inversion    Ankle eversion     (Blank rows = not tested)  LUMBAR SPECIAL TESTS:  Straight leg raise test: Negative, SI Compression/distraction test: Positive, FABER test: Negative, and More pain noted with repeated extension  FUNCTIONAL TESTS:  5 times sit to stand: 12.39 Timed up and go (TUG): 7.96  GAIT: Distance walked: in clinic Assistive device utilized: None Level of assistance: Complete Independence Comments: no pain noted during gait  TREATMENT DATE:  06/30/23 Eval                                                                                                                                 PATIENT EDUCATION:  Education details: POC and HEP Person educated: Patient Education method: Explanation Education comprehension: verbalized understanding  HOME EXERCISE PROGRAM: [id number] Supine Pelvic Tilts Bridges Seated Piriformis Stretch Dead Bugs Supine Trunk Rotations   ASSESSMENT:  CLINICAL IMPRESSION: Patient is a 33 y.o. female who was seen today for physical therapy evaluation and treatment for chronic low back pain without sciatica. Pt mentioned that her pain began during her pregnancy around 3-4 years ago. Her pain was reproduced with the SI distraction and compression tests and with repeated extension. We determined that her SI joint is the source of her pain. Pt will benefit from PT to increase mobility and strength to better support the SI joint prior to any future pregnancy.   OBJECTIVE IMPAIRMENTS: decreased ROM, hypomobility, and pain.   ACTIVITY LIMITATIONS: carrying, lifting, bending, sitting, standing, squatting, and sleeping  PARTICIPATION LIMITATIONS: meal prep, cleaning, laundry, and driving  PERSONAL FACTORS: Age, Behavior pattern, Education, Fitness, and Sex are also affecting patient's functional outcome.   REHAB POTENTIAL: Excellent  CLINICAL DECISION MAKING: Stable/uncomplicated  EVALUATION COMPLEXITY: Low   GOALS: Goals reviewed with patient? Yes  SHORT TERM GOALS: Target date: 07/28/23  Patient will be independent with initial HEP.  Baseline: given 06/30/23 Goal status: INITIAL   LONG TERM GOALS: Target date: 08/25/23  Patient will be independent with advanced/ongoing HEP to improve outcomes and carryover.  Baseline:  Goal status: INITIAL  2.  Patient will report 50-75% improvement in  low back pain to improve QOL.  Baseline: 6/10 (06/30/23) Goal status: INITIAL  3.  Patient will report centralization of radicular symptoms.  Baseline:  Goal status: INITIAL  4.  Patient will demonstrate full pain free  lumbar ROM to perform ADLs.   Baseline: see chart above Goal status: INITIAL  5.  Patient will report 10/50 on lumbar modified oswestry to demonstrate improved functional ability.  Baseline: 16/50 (06/30/23) Goal status: INITIAL   6.  Patient will tolerate 30-60 min of standing and sitting to perform household chores. Baseline: <30 (06/30/23) Goal status: INITIAL   PLAN:  PT FREQUENCY: 1x/week  PT DURATION: 8 weeks  PLANNED INTERVENTIONS: 97110-Therapeutic exercises, 97530- Therapeutic activity, 97112- Neuromuscular re-education, 97535- Self Care, 91478- Manual therapy, 97014- Electrical stimulation (unattended), [phone]- Traction (mechanical), Patient/Family education, Taping, Dry Needling, Joint mobilization, Joint manipulation, Spinal manipulation, Spinal mobilization, Cryotherapy, and Moist heat.  PLAN FOR NEXT SESSION: continue stretching and strengthening low back and hips, joint mobilizations to SI joint   Pascal Lux, Student-PT 06/30/2023, 9:42 AM

## 2023-06-30 NOTE — Therapy (Deleted)
OUTPATIENT PHYSICAL THERAPY THORACOLUMBAR EVALUATION   Patient Name: Sandra Wilcox MRN: [id number] DOB:[birth year], 33 y.o., female Today's Date: 06/30/2023  END OF SESSION:  PT End of Session - 06/30/23 0837     Visit Number 1    Date for PT Re-Evaluation 08/25/23    PT Start Time 0840    PT Stop Time 0925    PT Time Calculation (min) 45 min    Activity Tolerance Patient tolerated treatment well    Behavior During Therapy [facility] for tasks assessed/performed             Past Medical History:  Diagnosis Date   Allergy    HSV (herpes simplex virus) anogenital infection    Ovarian cyst    History reviewed. No pertinent surgical history. Patient Active Problem List   Diagnosis Date Noted   Indication for care in labor or delivery 10/17/2019   Allergic rhinitis 10/11/2012    PCP: Dr. Ladora Daniel  REFERRING PROVIDER: Dr. 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 Back started hurting with pregnancy. X-ray primary referred here. No specific incidence to begin back pain, came on over time.   PERTINENT HISTORY:  No PSH  PAIN:  Are you having pain? Yes: NPRS scale: 6/10 (end of day) Pain location: bilateral low back Pain description: dull achy, sharp with flexion Aggravating factors: movement, prolonged standing Relieving factors: heat, yoga, elevation, deep pressure, massage gun   PRECAUTIONS: None  RED FLAGS: None   WEIGHT BEARING RESTRICTIONS: No  FALLS:  Has  patient fallen in last 6 months? No  LIVING ENVIRONMENT: Lives with: lives with their family Lives in: House/apartment Stairs: internal - 1 flight, external - few steps Has following equipment at home: None  OCCUPATION: sign language interpreter, part time  PLOF: Independent and Independent with basic ADLs  PATIENT GOALS: find root cause, strengthening, work on core strength, pain relief  NEXT MD VISIT: none scheduled  OBJECTIVE:  Note: Objective measures were completed at Evaluation unless otherwise noted.  DIAGNOSTIC FINDINGS:  X-ray: no significant degenerative findings  PATIENT SURVEYS:  Modified Oswestry 16/50 - 32%   COGNITION: Overall cognitive  status: Within functional limits for tasks assessed     SENSATION: WFL, hands sometimes go numb  POSTURE: No Significant postural limitations  PALPATION: SI joint - tender with manual pressure  LUMBAR ROM:   AROM eval  Flexion WFL*  Extension 75%*   Right lateral flexion WFL  Left lateral flexion WFL  Right rotation WFL  Left rotation WFL   (Blank rows = not tested)  LOWER EXTREMITY ROM:     Active  Right eval Left eval  Hip flexion [facility]  Hip extension    Hip abduction    Hip adduction    Hip internal rotation    Hip external rotation    Knee flexion    Knee extension    Ankle dorsiflexion    Ankle plantarflexion    Ankle inversion    Ankle eversion     (Blank rows = not tested)  LUMBAR SPECIAL TESTS:  Straight leg raise test: Negative, SI Compression/distraction test: Positive, FABER test: Negative, and More pain noted with repeated extension  FUNCTIONAL TESTS:  5 times sit to stand: 12.39 Timed up and go (TUG): 7.96  GAIT: Distance walked: in clinic Assistive device utilized: None Level of assistance: Complete Independence Comments: no pain noted during gait  TREATMENT DATE:  06/30/23 Eval                                                                                                                                 PATIENT EDUCATION:  Education details: POC and HEP Person educated: Patient Education method: Explanation Education comprehension: verbalized understanding  HOME EXERCISE PROGRAM: [id number] Supine Pelvic Tilts Bridges Seated Piriformis Stretch Dead Bugs Supine Trunk Rotations   ASSESSMENT:  CLINICAL IMPRESSION: Patient is a 33 y.o. female who was seen today for physical therapy evaluation and treatment for chronic low back pain without sciatica. Pt mentioned that her pain began during her pregnancy around 3-4 years ago. Her pain was reproduced with the SI distraction and compression tests and with repeated extension. We determined that her SI joint is the source of her pain. Pt will benefit from PT to increase mobility and strength to better support the SI joint prior to any future pregnancy.   OBJECTIVE IMPAIRMENTS: decreased ROM, hypomobility, and pain.   ACTIVITY LIMITATIONS: carrying, lifting, bending, sitting, standing, squatting, and sleeping  PARTICIPATION LIMITATIONS: meal prep, cleaning, laundry, and driving  PERSONAL FACTORS: Age, Behavior pattern, Education, Fitness, and Sex are also affecting patient's functional outcome.   REHAB POTENTIAL: Excellent  CLINICAL DECISION MAKING: Stable/uncomplicated  EVALUATION COMPLEXITY: Low   GOALS: Goals reviewed with patient? Yes  SHORT TERM GOALS: Target date: 07/28/23  Patient will be independent with initial HEP.  Baseline: given 06/30/23 Goal status: INITIAL   LONG TERM GOALS: Target date: 08/25/23  Patient will be independent with advanced/ongoing HEP to improve outcomes and carryover.  Baseline:  Goal status: INITIAL  2.  Patient will report 50-75% improvement in  low back pain to improve QOL.  Baseline: 6/10 (06/30/23) Goal status: INITIAL  3.  Patient will report centralization of radicular symptoms.  Baseline:  Goal status: INITIAL  4.  Patient will demonstrate full pain free  lumbar ROM to perform ADLs.   Baseline: see chart above Goal status: INITIAL  5.  Patient will report 10/50 on lumbar modified oswestry to demonstrate improved functional ability.  Baseline: 16/50 (06/30/23) Goal status: INITIAL   6.  Patient will tolerate 30-60 min of standing and sitting to perform household chores. Baseline: <30 (06/30/23) Goal status: INITIAL   PLAN:  PT FREQUENCY: 1x/week  PT DURATION: 8 weeks  PLANNED INTERVENTIONS: 97110-Therapeutic exercises, 97530- Therapeutic activity, 97112- Neuromuscular re-education, 97535- Self Care, 96045- Manual therapy, 97014- Electrical stimulation (unattended), [phone]- Traction (mechanical), Patient/Family education, Taping, Dry Needling, Joint mobilization, Joint manipulation, Spinal manipulation, Spinal mobilization, Cryotherapy, and Moist heat.  PLAN FOR NEXT SESSION: continue stretching and strengthening low back and hips, joint mobilizations to SI joint   Cassie Freer, PT 06/30/2023, 9:43 AM

## 2023-07-12 ENCOUNTER — Ambulatory Visit: Payer: Managed Care, Other (non HMO) | Attending: Physician Assistant

## 2023-07-12 DIAGNOSIS — M5459 Other low back pain: Secondary | ICD-10-CM | POA: Diagnosis present

## 2023-07-12 DIAGNOSIS — M5417 Radiculopathy, lumbosacral region: Secondary | ICD-10-CM | POA: Insufficient documentation

## 2023-07-12 DIAGNOSIS — M533 Sacrococcygeal disorders, not elsewhere classified: Secondary | ICD-10-CM | POA: Insufficient documentation

## 2023-07-12 DIAGNOSIS — M6281 Muscle weakness (generalized): Secondary | ICD-10-CM | POA: Insufficient documentation

## 2023-07-12 NOTE — Therapy (Addendum)
 OUTPATIENT PHYSICAL THERAPY THORACOLUMBAR TREATMENT   Patient Name: Sandra Wilcox MRN: [id number] DOB:11-01-1990, 33 y.o., female Today's Date: 07/12/2023  END OF SESSION:    Past Medical History:  Diagnosis Date   Allergy    HSV (herpes simplex virus) anogenital infection    Ovarian cyst    No past surgical history on file. Patient Active Problem List   Diagnosis Date Noted   Indication for care in labor or delivery 10/17/2019   Allergic rhinitis 10/11/2012    PCP: Dr. Tylene Meres  REFERRING PROVIDER: Dr. 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  ONSET DATE: 3-4 years ago  SUBJECTIVE:                                                                                                                                                                                           SUBJECTIVE STATEMENT: Pt stated that she is now pregnant, ~ [redacted] wks along. She has been able to her exercises daily which have seemed to be helpful.   PERTINENT HISTORY:  No PSH  PAIN:  Are you having pain? Yes: NPRS scale: 4/10 Pain location: bilateral low back Pain description: dull achy, sharp with flexion Aggravating factors: movement, prolonged standing Relieving factors: heat, yoga, elevation, deep pressure, massage gun   PRECAUTIONS: None  RED FLAGS: None   WEIGHT BEARING RESTRICTIONS: No  FALLS:  Has patient fallen in last 6 months? No  LIVING ENVIRONMENT: Lives with: lives with their family Lives in: House/apartment Stairs: internal - 1 flight, external - few steps Has following equipment at home: None  OCCUPATION: sign language interpreter, part time  PLOF: Independent and Independent with basic ADLs  PATIENT GOALS: find root cause, strengthening, work on core strength, pain relief  NEXT MD VISIT: none scheduled  OBJECTIVE:  Note: Objective measures  were completed at Evaluation unless otherwise noted.  DIAGNOSTIC FINDINGS:  X-ray: no significant degenerative findings  PATIENT SURVEYS:  Modified Oswestry 16/50 - 33%   COGNITION: Overall cognitive status: Within functional limits for tasks assessed     SENSATION: WFL, hands sometimes go numb  POSTURE: No Significant postural limitations  PALPATION: SI joint - tender with manual pressure  LUMBAR ROM:   AROM eval  Flexion WFL*  Extension 75%*   Right lateral flexion WFL  Left lateral flexion WFL  Right rotation WFL  Left rotation WFL   (Blank rows = not tested)  LOWER EXTREMITY ROM:     Active  Right eval Left eval  Hip flexion [facility]  Hip extension  Hip abduction    Hip adduction    Hip internal rotation    Hip external rotation    Knee flexion    Knee extension    Ankle dorsiflexion    Ankle plantarflexion    Ankle inversion    Ankle eversion     (Blank rows = not tested)  LUMBAR SPECIAL TESTS:  Straight leg raise test: Negative, SI Compression/distraction test: Positive, FABER test: Negative, and More pain noted with repeated extension  FUNCTIONAL TESTS:  5 times sit to stand: 12.39 Timed up and go (TUG): 7.96  GAIT: Distance walked: in clinic Assistive device utilized: None Level of assistance: Complete Independence Comments: no pain noted during gait  TREATMENT DATE:  07/12/23 Bike L3x21min Pelvic Floor Progression: - Sidelying Adduction w/bottom leg- top leg on foam roller using hand to keep opposite hip down 2x8 each side - holding up top leg ER with bottom leg 2x8 each side - holding up both legs bottom leg doing IR 1x8 each side - hold both legs and rotating 1x8 each side Bridges with ball Squeeze 2x8  Bird Dogs 2x8 each side Cat Cows x 10 Manual Therapy: Glutes, Piriformis   1/33/25-Eval                                                                                                                                PATIENT  EDUCATION:  Education details: POC and HEP Person educated: Patient Education method: Explanation Education comprehension: verbalized understanding  HOME EXERCISE PROGRAM: [id number] Supine Pelvic Tilts Bridges Seated Piriformis Stretch Dead Bugs Supine Trunk Rotations   ASSESSMENT:  CLINICAL IMPRESSION: Patient is a 33 y.o. female who was seen today for physical therapy treatment for chronic low back pain without sciatica. Pt tolerated treatment well, she is very aware of her body and is able to complete more difficult exercises with verbal cues. She did note that she is pregnant and about [redacted] wks along. We worked mainly on pelvic floor and hip exercises today to help better support her back pain and to carry over throughout her pregnancy. She mentioned having a constant ache in her low back even throughout exercises, but no sharp or stabbing pain during any exercises. Pt will continue to benefit from skilled PT to increase strength of the pelvic floor and supporting hip muscles to help with her SI joint pain as she progresses through her pregnancy.   OBJECTIVE IMPAIRMENTS: decreased ROM, hypomobility, and pain.   ACTIVITY LIMITATIONS: carrying, lifting, bending, sitting, standing, squatting, and sleeping  PARTICIPATION LIMITATIONS: meal prep, cleaning, laundry, and driving  PERSONAL FACTORS: Age, Behavior pattern, Education, Fitness, and Sex are also affecting patient's functional outcome.   REHAB POTENTIAL: Excellent  CLINICAL DECISION MAKING: Stable/uncomplicated  EVALUATION COMPLEXITY: Low   GOALS: Goals reviewed with patient? Yes  SHORT TERM GOALS: Target date: 07/28/23  Patient will be independent with initial HEP.  Baseline: given 06/30/23 Goal status: INITIAL   LONG TERM  GOALS: Target date: 08/25/23  Patient will be independent with advanced/ongoing HEP to improve outcomes and carryover.  Baseline:  Goal status: INITIAL  2.  Patient will report 50-75% improvement  in low back pain to improve QOL.  Baseline: 6/10 (1/33/25) Goal status: INITIAL  3.  Patient will report centralization of radicular symptoms.  Baseline:  Goal status: INITIAL  4.  Patient will demonstrate full pain free lumbar ROM to perform ADLs.   Baseline: see chart above Goal status: INITIAL  5.  Patient will report 10/50 on lumbar modified oswestry to demonstrate improved functional ability.  Baseline: 16/50 (1/33/25) Goal status: INITIAL   6.  Patient will tolerate 30-60 min of standing and sitting to perform household chores. Baseline: <30 (1/33/25) Goal status: INITIAL   PLAN:  PT FREQUENCY: 1x/week  PT DURATION: 8 weeks  PLANNED INTERVENTIONS: 97110-Therapeutic exercises, 97530- Therapeutic activity, 97112- Neuromuscular re-education, 97535- Self Care, [id number]- Manual therapy, 97014- Electrical stimulation (unattended), [phone]- Traction (mechanical), Patient/Family education, Taping, Dry Needling, Joint mobilization, Joint manipulation, Spinal manipulation, Spinal mobilization, Cryotherapy, and Moist heat.  PLAN FOR NEXT SESSION: continue stretching and strengthening low back and hips, joint mobilizations to SI joint   Larraine Nordmann, Student-PT 07/12/2023, 8:02 AM

## 2023-07-19 ENCOUNTER — Ambulatory Visit: Payer: Managed Care, Other (non HMO) | Admitting: Physical Therapy

## 2023-07-19 ENCOUNTER — Encounter: Payer: Self-pay | Admitting: Physical Therapy

## 2023-07-19 DIAGNOSIS — M533 Sacrococcygeal disorders, not elsewhere classified: Secondary | ICD-10-CM | POA: Diagnosis not present

## 2023-07-19 DIAGNOSIS — M5417 Radiculopathy, lumbosacral region: Secondary | ICD-10-CM

## 2023-07-19 DIAGNOSIS — M6281 Muscle weakness (generalized): Secondary | ICD-10-CM

## 2023-07-19 DIAGNOSIS — M5459 Other low back pain: Secondary | ICD-10-CM

## 2023-07-19 NOTE — Therapy (Signed)
 OUTPATIENT PHYSICAL THERAPY THORACOLUMBAR TREATMENT   Patient Name: ANNALINA NEEDLES MRN: [id number] DOB:07-06-1990, 33 y.o., female Today's Date: 07/19/2023  END OF SESSION:  PT End of Session - 07/19/23 0845     Visit Number 3    Date for PT Re-Evaluation 08/25/23    PT Start Time 0846    PT Stop Time 0924    PT Time Calculation (min) 38 min    Activity Tolerance Patient tolerated treatment well    Behavior During Therapy [facility] for tasks assessed/performed              Past Medical History:  Diagnosis Date   Allergy    HSV (herpes simplex virus) anogenital infection    Ovarian cyst    History reviewed. No pertinent surgical history. Patient Active Problem List   Diagnosis Date Noted   Indication for care in labor or delivery 10/17/2019   Allergic rhinitis 10/11/2012    PCP: Dr. Ladora Daniel  REFERRING PROVIDER: Dr. 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 Exercises are helping, my thing used to be not moving because my back hurts but the movement does really help overall. When I exercise my hips sometimes they get really tight and it pulls my knee a bit.    PERTINENT HISTORY:  No PSH  PAIN:  Are you having pain? No 0/10 now   PRECAUTIONS: None  RED FLAGS: None   WEIGHT BEARING RESTRICTIONS: No  FALLS:  Has patient fallen in last 6 months? No  LIVING ENVIRONMENT: Lives with: lives with their family Lives in:  House/apartment Stairs: internal - 1 flight, external - few steps Has following equipment at home: None  OCCUPATION: sign language interpreter, part time  PLOF: Independent and Independent with basic ADLs  PATIENT GOALS: find root cause, strengthening, work on core strength, pain relief  NEXT MD VISIT: none scheduled  OBJECTIVE:  Note: Objective measures were completed at Evaluation unless otherwise noted.  DIAGNOSTIC FINDINGS:  X-ray: no significant degenerative findings  PATIENT SURVEYS:  Modified Oswestry 16/50 - 32%   COGNITION: Overall cognitive status: Within  functional limits for tasks assessed     SENSATION: WFL, hands sometimes go numb  POSTURE: No Significant postural limitations  PALPATION: SI joint - tender with manual pressure  LUMBAR ROM:   AROM eval  Flexion WFL*  Extension 75%*   Right lateral flexion WFL  Left lateral flexion WFL  Right rotation WFL  Left rotation WFL   (Blank rows = not tested)  LOWER EXTREMITY ROM:     Active  Right eval Left eval  Hip flexion [facility]  Hip extension    Hip abduction    Hip adduction    Hip internal rotation    Hip external rotation    Knee flexion    Knee extension    Ankle dorsiflexion    Ankle plantarflexion    Ankle inversion    Ankle eversion     (Blank rows = not tested)  LUMBAR SPECIAL TESTS:  Straight leg raise test: Negative, SI Compression/distraction test: Positive, FABER test: Negative, and More pain noted with repeated extension  FUNCTIONAL TESTS:  5 times sit to stand: 12.39 Timed up and go (TUG): 7.96  GAIT: Distance walked: in clinic Assistive device utilized: None Level of assistance: Complete Independence Comments: no pain noted during gait  TREATMENT DATE:   07/19/23  PPT 15x3 seconds  PPT + march x12 PPT opposite/alternating UE/LE x12 B PPT  flutter kicks 5x8 hands under Lx spine, cues to not arch low back PPT + double heel raise/lowers with bent knees x10  PPT +  CW/CCW circles with straight leg x10 B 2 rounds  PPT + alphabet x1 with straight leg 2 rounds B Standing hip hikes (to help get the feel of form) x10 B Sidelying hip hikes + CW/CCW circles x10 each B (2 rounds) Nustep L4x6 minutes for increased tissue perfusion prior to stretches  HS stretches 2x30 seconds RLE with strap  R TFL stretch sidelying 2x30 seconds    07/12/23 Bike L3x39min Pelvic Floor Progression: - Sidelying Adduction w/bottom leg- top leg on foam roller using hand to keep opposite hip down 2x8 each side - holding up top leg ER with bottom leg 2x8 each side - holding up both legs bottom leg doing IR 1x8 each side - hold both legs and rotating 1x8 each side Bridges with ball Squeeze 2x8  Bird Dogs 2x8 each side Cat Cows x 10 Manual Therapy: Glutes, Piriformis   06/30/23-Eval                                                                                                                                PATIENT EDUCATION:  Education details: POC and HEP Person educated: Patient Education method: Explanation Education comprehension: verbalized understanding  HOME EXERCISE PROGRAM: [id number] Supine Pelvic Tilts Bridges Seated Piriformis Stretch Dead Bugs Supine Trunk Rotations   ASSESSMENT:  CLINICAL IMPRESSION:   Pt arrives today doing OK, does report some mm tightness from hip workouts that's been bothering her  knee a bit so we worked in some more flexibility work at the end of session today. Otherwise kept working on core/hip strength as well as pelvic floor progressions. Did well today, anticipate she will have great outcomes with PT!      OBJECTIVE IMPAIRMENTS: decreased ROM, hypomobility, and pain.   ACTIVITY LIMITATIONS: carrying, lifting, bending, sitting, standing, squatting, and sleeping  PARTICIPATION LIMITATIONS: meal prep, cleaning, laundry, and driving  PERSONAL FACTORS: Age, Behavior pattern, Education, Fitness, and Sex are also affecting  patient's functional outcome.   REHAB POTENTIAL: Excellent  CLINICAL DECISION MAKING: Stable/uncomplicated  EVALUATION COMPLEXITY: Low   GOALS: Goals reviewed with patient? Yes  SHORT TERM GOALS: Target date: 07/28/23  Patient will be independent with initial HEP.  Baseline: given 06/30/23 Goal status: INITIAL   LONG TERM GOALS: Target date: 08/25/23  Patient will be independent with advanced/ongoing HEP to improve outcomes and carryover.  Baseline:  Goal status: INITIAL  2.  Patient will report 50-75% improvement in low back pain to improve QOL.  Baseline: 6/10 (06/30/23) Goal status: INITIAL  3.  Patient will report centralization of radicular symptoms.  Baseline:  Goal status: INITIAL  4.  Patient will demonstrate full pain free lumbar ROM to perform ADLs.   Baseline: see chart above Goal status: INITIAL  5.  Patient will report 10/50 on lumbar modified oswestry to demonstrate improved functional ability.  Baseline: 16/50 (06/30/23) Goal status: INITIAL   6.  Patient will tolerate 30-60 min of standing and sitting to perform household chores. Baseline: <30 (06/30/23) Goal status: INITIAL   PLAN:  PT FREQUENCY: 1x/week  PT DURATION: 8 weeks  PLANNED INTERVENTIONS: 97110-Therapeutic exercises, 97530- Therapeutic activity, 97112- Neuromuscular re-education, 97535- Self Care, 14782- Manual therapy, 97014- Electrical stimulation (unattended), [phone]- Traction (mechanical), Patient/Family education, Taping, Dry Needling, Joint mobilization, Joint manipulation, Spinal manipulation, Spinal mobilization, Cryotherapy, and Moist heat.  PLAN FOR NEXT SESSION: continue stretching and strengthening low back and hips, address SI joint PRN, keep working on TFL flexibility   Nedra Hai, PT, DPT 07/19/23 9:25 AM

## 2023-07-27 NOTE — Therapy (Incomplete)
OUTPATIENT PHYSICAL THERAPY THORACOLUMBAR TREATMENT   Patient Name: Sandra Wilcox MRN: [id number] DOB:1991/03/14, 33 y.o., female Today's Date: 07/27/2023  END OF SESSION:     Past Medical History:  Diagnosis Date   Allergy    HSV (herpes simplex virus) anogenital infection    Ovarian cyst    No past surgical history on file. Patient Active Problem List   Diagnosis Date Noted   Indication for care in labor or delivery 10/17/2019   Allergic rhinitis 10/11/2012    PCP: Dr. Ladora Daniel  REFERRING PROVIDER: Dr. 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  ONSET DATE: 3-4 years ago  SUBJECTIVE:                                                                                                                                                                                           SUBJECTIVE STATEMENT:   I'm early stage pregnancy, feeling kind of sick and woozy from that this morning. Exercises are helping, my thing used to be not moving because my back hurts but the movement does really help overall. When I exercise my hips sometimes they get really tight and it pulls my knee a bit.    PERTINENT HISTORY:  No PSH  PAIN:  Are you having pain? No 0/10 now   PRECAUTIONS: None  RED FLAGS: None   WEIGHT BEARING RESTRICTIONS: No  FALLS:  Has patient fallen in last 6 months? No  LIVING ENVIRONMENT: Lives with: lives with their family Lives in: House/apartment Stairs: internal - 1 flight, external - few steps Has following equipment at home: None  OCCUPATION: sign language interpreter, part time  PLOF: Independent and Independent with basic ADLs  PATIENT GOALS: find root cause, strengthening, work on core strength, pain relief  NEXT MD VISIT: none scheduled  OBJECTIVE:  Note: Objective measures were completed at Evaluation unless otherwise  noted.  DIAGNOSTIC FINDINGS:  X-ray: no significant degenerative findings  PATIENT SURVEYS:  Modified Oswestry 16/50 - 32%   COGNITION: Overall cognitive status: Within functional limits for tasks assessed     SENSATION: WFL, hands sometimes go numb  POSTURE: No Significant postural limitations  PALPATION: SI joint - tender with manual pressure  LUMBAR ROM:   AROM eval  Flexion WFL*  Extension 75%*   Right lateral flexion WFL  Left lateral flexion WFL  Right rotation WFL  Left rotation WFL   (Blank rows = not tested)  LOWER EXTREMITY ROM:     Active  Right eval Left eval  Hip flexion [facility]  WFL  Hip extension    Hip abduction    Hip adduction    Hip internal rotation    Hip external rotation    Knee flexion    Knee extension    Ankle dorsiflexion    Ankle plantarflexion    Ankle inversion    Ankle eversion     (Blank rows = not tested)  LUMBAR SPECIAL TESTS:  Straight leg raise test: Negative, SI Compression/distraction test: Positive, FABER test: Negative, and More pain noted with repeated extension  FUNCTIONAL TESTS:  5 times sit to stand: 12.39 Timed up and go (TUG): 7.96  GAIT: Distance walked: in clinic Assistive device utilized: None Level of assistance: Complete Independence Comments: no pain noted during gait  TREATMENT DATE:  07/28/23 NuStep Banded bridges  Bridge with march SL bridge  Butterfly stretch Hip hinge/deadlift with 5# KB Pallof press with green band  Straight arm pull with green band  Pelvis circles sitting on physioball and pelvis tilts forward and back Low back ext with green band   07/19/23 PPT 15x3 seconds  PPT + march x12 PPT opposite/alternating UE/LE x12 B PPT flutter kicks 5x8 hands under Lx spine, cues to not arch low back PPT + double heel raise/lowers with bent knees x10  PPT + CW/CCW circles with straight leg x10 B 2 rounds  PPT + alphabet x1 with straight leg 2 rounds B Standing hip hikes (to help get  the feel of form) x10 B Sidelying hip hikes + CW/CCW circles x10 each B (2 rounds) Nustep L4x6 minutes for increased tissue perfusion prior to stretches  HS stretches 2x30 seconds RLE with strap  R TFL stretch sidelying 2x30 seconds    07/12/23 Bike L3x49min Pelvic Floor Progression: - Sidelying Adduction w/bottom leg- top leg on foam roller using hand to keep opposite hip down 2x8 each side - holding up top leg ER with bottom leg 2x8 each side - holding up both legs bottom leg doing IR 1x8 each side - hold both legs and rotating 1x8 each side Bridges with ball Squeeze 2x8  Bird Dogs 2x8 each side Cat Cows x 10 Manual Therapy: Glutes, Piriformis   06/30/23-Eval                                                                                                                                PATIENT EDUCATION:  Education details: POC and HEP Person educated: Patient Education method: Explanation Education comprehension: verbalized understanding  HOME EXERCISE PROGRAM: [id number] Supine Pelvic Tilts Bridges Seated Piriformis Stretch Dead Bugs Supine Trunk Rotations   ASSESSMENT:  CLINICAL IMPRESSION: Pt arrives today doing OK, does report some mm tightness from hip workouts that's been bothering her knee a bit so we worked in some more flexibility work at the end of session today. Otherwise kept working on core/hip strength as well as pelvic floor progressions. Did well today, anticipate she will have great outcomes with PT!  OBJECTIVE IMPAIRMENTS: decreased ROM, hypomobility, and pain.   ACTIVITY LIMITATIONS: carrying, lifting, bending, sitting, standing, squatting, and sleeping  PARTICIPATION LIMITATIONS: meal prep, cleaning, laundry, and driving  PERSONAL FACTORS: Age, Behavior pattern, Education, Fitness, and Sex are also affecting patient's functional outcome.   REHAB POTENTIAL: Excellent  CLINICAL DECISION MAKING: Stable/uncomplicated  EVALUATION COMPLEXITY:  Low   GOALS: Goals reviewed with patient? Yes  SHORT TERM GOALS: Target date: 07/28/23  Patient will be independent with initial HEP.  Baseline: given 06/30/23 Goal status: INITIAL   LONG TERM GOALS: Target date: 08/25/23  Patient will be independent with advanced/ongoing HEP to improve outcomes and carryover.  Baseline:  Goal status: INITIAL  2.  Patient will report 50-75% improvement in low back pain to improve QOL.  Baseline: 6/10 (06/30/23) Goal status: INITIAL  3.  Patient will report centralization of radicular symptoms.  Baseline:  Goal status: INITIAL  4.  Patient will demonstrate full pain free lumbar ROM to perform ADLs.   Baseline: see chart above Goal status: INITIAL  5.  Patient will report 10/50 on lumbar modified oswestry to demonstrate improved functional ability.  Baseline: 16/50 (06/30/23) Goal status: INITIAL   6.  Patient will tolerate 30-60 min of standing and sitting to perform household chores. Baseline: <30 (06/30/23) Goal status: INITIAL   PLAN:  PT FREQUENCY: 1x/week  PT DURATION: 8 weeks  PLANNED INTERVENTIONS: 97110-Therapeutic exercises, 97530- Therapeutic activity, 97112- Neuromuscular re-education, 97535- Self Care, 28413- Manual therapy, 97014- Electrical stimulation (unattended), [phone]- Traction (mechanical), Patient/Family education, Taping, Dry Needling, Joint mobilization, Joint manipulation, Spinal manipulation, Spinal mobilization, Cryotherapy, and Moist heat.  PLAN FOR NEXT SESSION: continue stretching and strengthening low back and hips, address SI joint PRN, keep working on TFL flexibility   Nedra Hai, PT, DPT 07/27/23 5:13 PM

## 2023-07-28 ENCOUNTER — Ambulatory Visit: Payer: Managed Care, Other (non HMO)

## 2023-08-04 ENCOUNTER — Ambulatory Visit: Payer: Managed Care, Other (non HMO)

## 2023-08-11 LAB — OB RESULTS CONSOLE GC/CHLAMYDIA
Chlamydia: NEGATIVE
Neisseria Gonorrhea: NEGATIVE

## 2023-08-11 LAB — OB RESULTS CONSOLE HEPATITIS B SURFACE ANTIGEN: Hepatitis B Surface Ag: NEGATIVE

## 2023-08-11 LAB — OB RESULTS CONSOLE RPR: RPR: NONREACTIVE

## 2023-08-11 LAB — OB RESULTS CONSOLE RUBELLA ANTIBODY, IGM: Rubella: IMMUNE

## 2023-08-11 LAB — OB RESULTS CONSOLE HIV ANTIBODY (ROUTINE TESTING): HIV: NONREACTIVE

## 2023-08-11 LAB — HEPATITIS C ANTIBODY: HCV Ab: NEGATIVE

## 2023-12-11 LAB — OB RESULTS CONSOLE RPR: RPR: NONREACTIVE

## 2023-12-11 LAB — OB RESULTS CONSOLE HIV ANTIBODY (ROUTINE TESTING): HIV: NONREACTIVE

## 2024-02-16 LAB — OB RESULTS CONSOLE GBS: GBS: NEGATIVE

## 2024-02-24 ENCOUNTER — Inpatient Hospital Stay (HOSPITAL_COMMUNITY): Admitting: Anesthesiology

## 2024-02-24 ENCOUNTER — Encounter (HOSPITAL_COMMUNITY): Payer: Self-pay | Admitting: Obstetrics & Gynecology

## 2024-02-24 ENCOUNTER — Inpatient Hospital Stay (HOSPITAL_COMMUNITY)
Admission: AD | Admit: 2024-02-24 | Discharge: 2024-02-26 | DRG: 806 | Disposition: A | Discharge status: Home / Self Care | Attending: Obstetrics & Gynecology | Admitting: Obstetrics & Gynecology

## 2024-02-24 ENCOUNTER — Other Ambulatory Visit: Payer: Self-pay

## 2024-02-24 DIAGNOSIS — O4290 Premature rupture of membranes, unspecified as to length of time between rupture and onset of labor, unspecified weeks of gestation: Principal | ICD-10-CM | POA: Diagnosis present

## 2024-02-24 DIAGNOSIS — A6000 Herpesviral infection of urogenital system, unspecified: Secondary | ICD-10-CM | POA: Diagnosis present

## 2024-02-24 DIAGNOSIS — Z3A37 37 weeks gestation of pregnancy: Secondary | ICD-10-CM

## 2024-02-24 DIAGNOSIS — O99344 Other mental disorders complicating childbirth: Secondary | ICD-10-CM | POA: Diagnosis present

## 2024-02-24 DIAGNOSIS — Z3490 Encounter for supervision of normal pregnancy, unspecified, unspecified trimester: Secondary | ICD-10-CM

## 2024-02-24 DIAGNOSIS — F419 Anxiety disorder, unspecified: Secondary | ICD-10-CM | POA: Diagnosis present

## 2024-02-24 DIAGNOSIS — O4292 Full-term premature rupture of membranes, unspecified as to length of time between rupture and onset of labor: Principal | ICD-10-CM | POA: Diagnosis present

## 2024-02-24 DIAGNOSIS — O9832 Other infections with a predominantly sexual mode of transmission complicating childbirth: Secondary | ICD-10-CM | POA: Diagnosis present

## 2024-02-24 LAB — CBC
HCT: 33.8 % — ABNORMAL LOW (ref 36.0–46.0)
Hemoglobin: 11.3 g/dL — ABNORMAL LOW (ref 12.0–15.0)
MCH: 31.4 pg (ref 26.0–34.0)
MCHC: 33.4 g/dL (ref 30.0–36.0)
MCV: 93.9 fL (ref 80.0–100.0)
Platelets: 240 K/uL (ref 150–400)
RBC: 3.6 MIL/uL — ABNORMAL LOW (ref 3.87–5.11)
RDW: 13.5 % (ref 11.5–15.5)
WBC: 10.7 K/uL — ABNORMAL HIGH (ref 4.0–10.5)
nRBC: 0 % (ref 0.0–0.2)

## 2024-02-24 LAB — TYPE AND SCREEN
ABO/RH(D): O POS
Antibody Screen: NEGATIVE

## 2024-02-24 LAB — RUPTURE OF MEMBRANE (ROM)PLUS: Rom Plus: POSITIVE

## 2024-02-24 LAB — POCT FERN TEST: POCT Fern Test: NEGATIVE

## 2024-02-24 MED ORDER — ACETAMINOPHEN 325 MG PO TABS
650.0000 mg | ORAL_TABLET | ORAL | Status: DC | PRN
  Administered 2024-02-25 (×2): 650 mg via ORAL
  Filled 2024-02-24 (×2): qty 2

## 2024-02-24 MED ORDER — WITCH HAZEL-GLYCERIN EX PADS: 1.0000 | MEDICATED_PAD | CUTANEOUS | Status: DC | PRN

## 2024-02-24 MED ORDER — ZOLPIDEM TARTRATE 5 MG PO TABS: 5.0000 mg | ORAL_TABLET | Freq: Every evening | ORAL | Status: DC | PRN

## 2024-02-24 MED ORDER — DIPHENHYDRAMINE HCL 25 MG PO CAPS: 25.0000 mg | ORAL_CAPSULE | Freq: Four times a day (QID) | ORAL | Status: DC | PRN

## 2024-02-24 MED ORDER — SERTRALINE HCL 50 MG PO TABS
50.0000 mg | ORAL_TABLET | Freq: Every day | ORAL | Status: DC
  Administered 2024-02-24 – 2024-02-25 (×2): 50 mg via ORAL
  Filled 2024-02-24 (×2): qty 1

## 2024-02-24 MED ORDER — TERBUTALINE SULFATE 1 MG/ML IJ SOLN: 0.2500 mg | Freq: Once | INTRAMUSCULAR | Status: DC | PRN

## 2024-02-24 MED ORDER — OXYTOCIN-SODIUM CHLORIDE 30-0.9 UT/500ML-% IV SOLN
2.5000 [IU]/h | INTRAVENOUS | Status: DC
  Administered 2024-02-24: 2.5 [IU]/h via INTRAVENOUS

## 2024-02-24 MED ORDER — OXYCODONE-ACETAMINOPHEN 5-325 MG PO TABS: 1.0000 | ORAL_TABLET | ORAL | Status: DC | PRN

## 2024-02-24 MED ORDER — ACETAMINOPHEN 325 MG PO TABS: 650.0000 mg | ORAL_TABLET | ORAL | Status: DC | PRN

## 2024-02-24 MED ORDER — PRENATAL MULTIVITAMIN CH
1.0000 | ORAL_TABLET | Freq: Every day | ORAL | Status: DC
  Administered 2024-02-25 – 2024-02-26 (×2): 1 via ORAL
  Filled 2024-02-24 (×2): qty 1

## 2024-02-24 MED ORDER — DIPHENHYDRAMINE HCL 50 MG/ML IJ SOLN: 12.5000 mg | INTRAMUSCULAR | Status: DC | PRN

## 2024-02-24 MED ORDER — ONDANSETRON HCL 4 MG/2ML IJ SOLN: 4.0000 mg | Freq: Four times a day (QID) | INTRAMUSCULAR | Status: DC | PRN

## 2024-02-24 MED ORDER — FENTANYL-BUPIVACAINE-NACL 0.5-0.125-0.9 MG/250ML-% EP SOLN
12.0000 mL/h | EPIDURAL | Status: DC | PRN
  Administered 2024-02-24: 12 mL/h via EPIDURAL
  Filled 2024-02-24: qty 250

## 2024-02-24 MED ORDER — OXYTOCIN-SODIUM CHLORIDE 30-0.9 UT/500ML-% IV SOLN
1.0000 m[IU]/min | INTRAVENOUS | Status: DC
  Administered 2024-02-24: 2 m[IU]/min via INTRAVENOUS
  Filled 2024-02-24: qty 500

## 2024-02-24 MED ORDER — LIDOCAINE HCL (PF) 1 % IJ SOLN
INTRAMUSCULAR | Status: DC | PRN
  Administered 2024-02-24: 10 mL via EPIDURAL

## 2024-02-24 MED ORDER — EPHEDRINE 5 MG/ML INJ: 10.0000 mg | INTRAVENOUS | Status: DC | PRN

## 2024-02-24 MED ORDER — ONDANSETRON HCL 4 MG/2ML IJ SOLN: 4.0000 mg | INTRAMUSCULAR | Status: DC | PRN

## 2024-02-24 MED ORDER — COCONUT OIL OIL: 1.0000 | TOPICAL_OIL | Status: DC | PRN

## 2024-02-24 MED ORDER — IBUPROFEN 600 MG PO TABS
600.0000 mg | ORAL_TABLET | Freq: Four times a day (QID) | ORAL | Status: DC
  Administered 2024-02-24 – 2024-02-26 (×7): 600 mg via ORAL
  Filled 2024-02-24 (×7): qty 1

## 2024-02-24 MED ORDER — OXYTOCIN BOLUS FROM INFUSION
333.0000 mL | Freq: Once | INTRAVENOUS | Status: AC
  Administered 2024-02-24: 333 mL via INTRAVENOUS

## 2024-02-24 MED ORDER — CETIRIZINE HCL 10 MG PO TABS
5.0000 mg | ORAL_TABLET | Freq: Every evening | ORAL | Status: DC
Start: 2024-02-24 — End: 2024-02-26
  Administered 2024-02-24 – 2024-02-25 (×2): 5 mg via ORAL
  Filled 2024-02-24 (×3): qty 1

## 2024-02-24 MED ORDER — OXYCODONE-ACETAMINOPHEN 5-325 MG PO TABS: 2.0000 | ORAL_TABLET | ORAL | Status: DC | PRN

## 2024-02-24 MED ORDER — FENTANYL CITRATE (PF) 100 MCG/2ML IJ SOLN: 50.0000 ug | INTRAMUSCULAR | Status: DC | PRN

## 2024-02-24 MED ORDER — LACTATED RINGERS IV SOLN: INTRAVENOUS | Status: DC

## 2024-02-24 MED ORDER — TETANUS-DIPHTH-ACELL PERTUSSIS 5-2.5-18.5 LF-MCG/0.5 IM SUSY: 0.5000 mL | PREFILLED_SYRINGE | Freq: Once | INTRAMUSCULAR | Status: DC

## 2024-02-24 MED ORDER — LACTATED RINGERS IV SOLN
500.0000 mL | Freq: Once | INTRAVENOUS | Status: AC
  Administered 2024-02-24: 500 mL via INTRAVENOUS

## 2024-02-24 MED ORDER — LIDOCAINE HCL (PF) 1 % IJ SOLN: 30.0000 mL | INTRAMUSCULAR | Status: DC | PRN

## 2024-02-24 MED ORDER — SOD CITRATE-CITRIC ACID 500-334 MG/5ML PO SOLN: 30.0000 mL | ORAL | Status: DC | PRN

## 2024-02-24 MED ORDER — PHENYLEPHRINE 80 MCG/ML (10ML) SYRINGE FOR IV PUSH (FOR BLOOD PRESSURE SUPPORT): 80.0000 ug | PREFILLED_SYRINGE | INTRAVENOUS | Status: DC | PRN

## 2024-02-24 MED ORDER — SERTRALINE HCL 50 MG PO TABS
50.0000 mg | ORAL_TABLET | Freq: Every day | ORAL | Status: DC
  Filled 2024-02-24: qty 1

## 2024-02-24 MED ORDER — DIBUCAINE (PERIANAL) 1 % EX OINT: 1.0000 | TOPICAL_OINTMENT | CUTANEOUS | Status: DC | PRN

## 2024-02-24 MED ORDER — ONDANSETRON HCL 4 MG PO TABS: 4.0000 mg | ORAL_TABLET | ORAL | Status: DC | PRN

## 2024-02-24 MED ORDER — SENNOSIDES-DOCUSATE SODIUM 8.6-50 MG PO TABS
2.0000 | ORAL_TABLET | Freq: Every day | ORAL | Status: DC
  Administered 2024-02-25 – 2024-02-26 (×2): 2 via ORAL
  Filled 2024-02-24 (×2): qty 2

## 2024-02-24 MED ORDER — LACTATED RINGERS IV SOLN: 500.0000 mL | INTRAVENOUS | Status: DC | PRN

## 2024-02-24 MED ORDER — SIMETHICONE 80 MG PO CHEW: 80.0000 mg | CHEWABLE_TABLET | ORAL | Status: DC | PRN

## 2024-02-24 MED ORDER — BENZOCAINE-MENTHOL 20-0.5 % EX AERO
1.0000 | INHALATION_SPRAY | CUTANEOUS | Status: DC | PRN
  Administered 2024-02-25: 1 via TOPICAL
  Filled 2024-02-24: qty 56

## 2024-02-24 NOTE — MAU Note (Signed)
 Sandra Wilcox is a [redacted]w[redacted]d G3P1011 at [redacted]w[redacted]d who presents to MAU today with complaint of contractions occasionally. She denies vaginal bleeding. She endorses LOF since 0830. Her GBS status is negative. Reports normal fetal movement. Negative fern, positive ROM+.  LMP 06/09/2023     Report called to Duwaine Blumenthal DO for admission; provider to place orders. Patient transported to L&D

## 2024-02-24 NOTE — Anesthesia Procedure Notes (Signed)
 Epidural Patient location during procedure: OB Start time: 02/24/2024 5:10 PM End time: 02/24/2024 5:20 PM  Staffing Anesthesiologist: Niels Marien CROME, MD Performed: anesthesiologist   Preanesthetic Checklist Completed: patient identified, IV checked, risks and benefits discussed, monitors and equipment checked, pre-op evaluation and timeout performed  Epidural Patient position: sitting Prep: DuraPrep and site prepped and draped Patient monitoring: continuous pulse ox, blood pressure, heart rate and cardiac monitor Approach: midline Location: L3-L4 Injection technique: LOR air  Needle:  Needle type: Tuohy  Needle gauge: 17 G Needle length: 9 cm Needle insertion depth: 5 cm Catheter type: closed end flexible Catheter size: 19 Gauge Catheter at skin depth: 9 cm Test dose: negative  Assessment Sensory level: T8 Events: blood not aspirated, no cerebrospinal fluid, injection not painful, no injection resistance, no paresthesia and negative IV test  Additional Notes Patient identified. Risks/Benefits/Options discussed with patient including but not limited to bleeding, infection, nerve damage, paralysis, failed block, incomplete pain control, headache, blood pressure changes, nausea, vomiting, reactions to medication both or allergic, itching and postpartum back pain. Confirmed with bedside nurse the patient's most recent platelet count. Confirmed with patient that they are not currently taking any anticoagulation, have any bleeding history or any family history of bleeding disorders. Patient expressed understanding and wished to proceed. All questions were answered. Sterile technique was used throughout the entire procedure. Please see nursing notes for vital signs. Test dose was given through epidural catheter and negative prior to continuing to dose epidural or start infusion. Warning signs of high block given to the patient including shortness of breath, tingling/numbness in hands,  complete motor block, or any concerning symptoms with instructions to call for help. Patient was given instructions on fall risk and not to get out of bed. All questions and concerns addressed with instructions to call with any issues or inadequate analgesia.  Reason for block:procedure for pain

## 2024-02-24 NOTE — H&P (Signed)
 Sandra Wilcox is a 33 y.o. female G3P1011 at [redacted]w[redacted]d presenting for PROM.  SROM at 0830 with mild, occasional cramping.  Fluid was clear and no VB.  Active FM.  Patient has h/o HSV and has taken Valtrex  ppx; denies prodromal symptoms and lesions.  H/O anxiety on sertraline  50 mg.  GBS negative.  Low risk NIPT.     OB History     Gravida  3   Para  1   Term  1   Preterm      AB  1   Living  1      SAB  1   IAB      Ectopic      Multiple  0   Live Births  1          Past Medical History:  Diagnosis Date   Allergy    HSV (herpes simplex virus) anogenital infection    Ovarian cyst    Postpartum anxiety 2021   History reviewed. No pertinent surgical history. Family History: family history includes Cancer in her maternal grandfather and mother. Social History:  reports that she has never smoked. She has never used smokeless tobacco. She reports that she does not currently use alcohol after a past usage of about 6.0 standard drinks of alcohol per week. She reports that she does not use drugs.     Maternal Diabetes: No Genetic Screening: Normal Maternal Ultrasounds/Referrals: Normal Fetal Ultrasounds or other Referrals:  None Maternal Substance Abuse:  No Significant Maternal Medications:  Meds include: Zoloft  Significant Maternal Lab Results:  Group B Strep negative Number of Prenatal Visits:greater than 3 verified prenatal visits Maternal Vaccinations:TDap Other Comments:  None  Review of Systems Maternal Medical History:  Reason for admission: Rupture of membranes.   Contractions: Onset was 3-5 hours ago.   Frequency: rare.   Perceived severity is mild.   Fetal activity: Perceived fetal activity is normal.   Last perceived fetal movement was within the past hour.   Prenatal complications: no prenatal complications Prenatal Complications - Diabetes: none.   Dilation: 1 Effacement (%): 50 Station: -2 Exam by:: [employer] RN Blood pressure 117/82,  pulse (!) 107, temperature 98.4 F (36.9 C), temperature source Oral, resp. rate 18, height 5' 5 (1.651 m), weight 74.8 kg, last menstrual period 06/09/2023, SpO2 99%, unknown if currently breastfeeding. Maternal Exam:  Uterine Assessment: Contraction strength is mild.  Contraction frequency is rare.  Abdomen: Patient reports no abdominal tenderness. Fundal height is c/w dates.   Estimated fetal weight is 7#4.     Fetal Exam Fetal Monitor Review: Mode: ultrasound.   Baseline rate: 140.  Variability: moderate (6-25 bpm).   Pattern: no decelerations and accelerations present.   Fetal State Assessment: Category I - tracings are normal.   Physical Exam Constitutional:      Appearance: Normal appearance.  HENT:     Head: Normocephalic and atraumatic.  Pulmonary:     Effort: Pulmonary effort is normal.  Abdominal:     Palpations: Abdomen is soft.  Musculoskeletal:        General: Normal range of motion.     Cervical back: Normal range of motion.  Skin:    General: Skin is warm and dry.  Neurological:     Mental Status: She is alert and oriented to person, place, and time.  Psychiatric:        Mood and Affect: Mood normal.        Behavior: Behavior normal.  Prenatal labs: ABO, Rh: --/--/PENDING (09/20 1250) Antibody: PENDING (09/20 1250) Rubella: Immune (03/07 0000) RPR: Nonreactive (07/07 0000)  HBsAg: Negative (03/07 0000)  HIV: Non-reactive (07/07 0000)  GBS: Negative/-- (09/12 0000)   Assessment/Plan: 33 yo G3P1011 at [redacted]w[redacted]d with PROM -Recommend augmentation given duration from SROM and no labor sx and patient accepts.  Will start pitocin  -CLEA if desired -Anticipate NSVD   Duwaine Blumenthal 02/24/2024, 1:23 PM

## 2024-02-24 NOTE — Anesthesia Preprocedure Evaluation (Signed)
 Anesthesia Evaluation  Patient identified by MRN, date of birth, ID band Patient awake    Reviewed: Allergy & Precautions, NPO status , Patient's Chart, lab work & pertinent test results  Airway Mallampati: II  TM Distance: >3 FB Neck ROM: Full    Dental no notable dental hx.    Pulmonary neg pulmonary ROS   Pulmonary exam normal breath sounds clear to auscultation       Cardiovascular negative cardio ROS Normal cardiovascular exam Rhythm:Regular Rate:Normal     Neuro/Psych  PSYCHIATRIC DISORDERS Anxiety     negative neurological ROS     GI/Hepatic negative GI ROS, Neg liver ROS,,,  Endo/Other  negative endocrine ROS    Renal/GU negative Renal ROS  negative genitourinary   Musculoskeletal negative musculoskeletal ROS (+)    Abdominal   Peds  Hematology negative hematology ROS (+)   Anesthesia Other Findings Presents with SROM  Reproductive/Obstetrics (+) Pregnancy                              Anesthesia Physical Anesthesia Plan  ASA: 2  Anesthesia Plan: Epidural   Post-op Pain Management:    Induction:   PONV Risk Score and Plan: Treatment may vary due to age or medical condition  Airway Management Planned: Natural Airway  Additional Equipment:   Intra-op Plan:   Post-operative Plan:   Informed Consent: I have reviewed the patients History and Physical, chart, labs and discussed the procedure including the risks, benefits and alternatives for the proposed anesthesia with the patient or authorized representative who has indicated his/her understanding and acceptance.       Plan Discussed with: Anesthesiologist  Anesthesia Plan Comments: (Patient identified. Risks, benefits, options discussed with patient including but not limited to bleeding, infection, nerve damage, paralysis, failed block, incomplete pain control, headache, blood pressure changes, nausea, vomiting,  reactions to medication, itching, and post partum back pain. Confirmed with bedside nurse the patient's most recent platelet count. Confirmed with the patient that they are not taking any anticoagulation, have any bleeding history or any family history of bleeding disorders. Patient expressed understanding and wishes to proceed. All questions were answered. )        Anesthesia Quick Evaluation

## 2024-02-24 NOTE — MAU Note (Signed)
 Sandra Wilcox is a 33 y.o. at [redacted]w[redacted]d here in MAU reporting: possible SROM @ 0830 today, clear fluid. Reports occasional mild UC, normal fetal movement. Denies bleeding.   LMP: 06/09/2023 Onset of complaint: 0830 Pain score: 0 Vitals:   02/24/24 1105 02/24/24 1133  BP: 117/80 117/82  Pulse: (!) 106 (!) 107  Resp: 18   Temp: 98.4 F (36.9 C)   SpO2: 99%      FHT: 155  Lab orders placed from triage: ROM+

## 2024-02-25 LAB — CBC
HCT: 29.1 % — ABNORMAL LOW (ref 36.0–46.0)
Hemoglobin: 10.2 g/dL — ABNORMAL LOW (ref 12.0–15.0)
MCH: 32.3 pg (ref 26.0–34.0)
MCHC: 35.1 g/dL (ref 30.0–36.0)
MCV: 92.1 fL (ref 80.0–100.0)
Platelets: 203 K/uL (ref 150–400)
RBC: 3.16 MIL/uL — ABNORMAL LOW (ref 3.87–5.11)
RDW: 13.5 % (ref 11.5–15.5)
WBC: 11.5 K/uL — ABNORMAL HIGH (ref 4.0–10.5)
nRBC: 0 % (ref 0.0–0.2)

## 2024-02-25 LAB — RPR: RPR Ser Ql: NONREACTIVE

## 2024-02-25 LAB — BIRTH TISSUE RECOVERY COLLECTION (PLACENTA DONATION)

## 2024-02-25 MED ORDER — IBUPROFEN 600 MG PO TABS: 600.0000 mg | ORAL_TABLET | Freq: Four times a day (QID) | ORAL | 0 refills | Status: DC

## 2024-02-25 NOTE — Lactation Note (Signed)
 This note was copied from a baby's chart. Lactation Consultation Note  Patient Name: Sandra Wilcox Today's Date: 02/25/2024 Age:33 hours Reason for consult: Initial assessment;Early term 37-38.6wks   P1, Baby [redacted]w[redacted]d and sleeping skin to skin on father's chest. Mother is experienced with breastfeeding and states baby has been latching well. Suggest calling for LC to view next feeding. Discussed potentially pumping in addition to breastfeeding if baby becomes sleepy at the breast.   Feed on demand with cues.  Goal 8-12+ times per day after first 24 hrs.  Place baby STS if not cueing.  Provided volume guidelines for supplementation.   Maternal Data Has patient been taught Hand Expression?: Yes Does the patient have breastfeeding experience prior to this delivery?: Yes  Feeding Mother's Current Feeding Choice: Breast Milk  Interventions Interventions: Education  Discharge Pump: Personal;DEBP;Hands Free  Consult Status Consult Status: Follow-up Date: 02/26/24 Follow-up type: In-patient   Sandra Levorn Lemme  RN, IBCLC 02/25/2024, 10:13 AM

## 2024-02-25 NOTE — Progress Notes (Signed)
 MOB was referred for history of depression/anxiety. * Referral screened out by Clinical Social Worker because none of the following criteria appear to apply: ~ History of anxiety/depression during this pregnancy, or of post-partum depression following prior delivery. ~ Diagnosis of anxiety and/or depression within last 3 years OR * MOB's symptoms currently being treated with medication and/or therapy. MOB is prescribed sertraline 50mg  daily. Per prenatal care records, MOB is in therapy. Please contact the Clinical Social Worker if needs arise, by [facility] request, or if MOB scores greater than 9/yes to question 10 on [place] Postpartum Depression Screen.  Signed,  Sharyne LOIS Roulette, MSW, LCSWA, LCASA February 17, 2024 8:12 AM

## 2024-02-25 NOTE — Lactation Note (Signed)
 This note was copied from a baby's chart. Lactation Consultation Note  Patient Name: Sandra Wilcox Date: 02/25/2024 Age:33 hours Reason for consult: Follow-up assessment;Early term 37-38.6wks  P2, Baby [redacted]w[redacted]d.  Called to room to observe latch. Mother had baby latched in cross cradle hold when [facility] entered room with good depth. Noted intermittent swallows. Discussed pumping if baby becomes sleepy, mother aware.   Maternal Data Has patient been taught Hand Expression?: Yes Does the patient have breastfeeding experience prior to this delivery?: Yes  Feeding Mother's Current Feeding Choice: Breast Milk  LATCH Score Latch: Grasps breast easily, tongue down, lips flanged, rhythmical sucking.  Audible Swallowing: A few with stimulation  Type of Nipple: Everted at rest and after stimulation  Comfort (Breast/Nipple): Soft / non-tender  Hold (Positioning): No assistance needed to correctly position infant at breast.  LATCH Score: 9  Interventions Interventions: Education  Discharge Pump: Personal;DEBP;Hands Free  Consult Status Consult Status: Follow-up Date: 02/26/24 Follow-up type: In-patient   Shannon Levorn Lemme  RN, IBCLC 02/25/2024, 11:36 AM

## 2024-02-25 NOTE — Lactation Note (Signed)
 This note was copied from a baby's chart. Lactation Consultation Note  Patient Name: Sandra Wilcox Today's Date: 02/25/2024 Age:33 hours   Baby getting bathed. Mother resting.  Mother would prefer lactation visit later.  Shannon Levorn Lemme  RN, IBCLC 02/25/2024, 8:59 AM

## 2024-02-25 NOTE — Discharge Instructions (Signed)
Call MD for T>100.4, heavy vaginal bleeding, severe abdominal pain or respiratory distress.  Call office to schedule postpartum visit in 6 weeks.  Pelvic rest x 6 weeks.   

## 2024-02-25 NOTE — Discharge Summary (Signed)
 Postpartum Discharge Summary    Patient Name: Sandra Wilcox DOB: 1991/01/27 MRN: [id number]  Date of admission: 02/24/2024 Delivery date:02/24/2024 Delivering provider: DANNIELLE BOUCHARD Date of discharge: 02/25/2024  Admitting diagnosis: PROM (premature rupture of membranes) [O42.90] Pregnancy [Z34.90] Intrauterine pregnancy: [redacted]w[redacted]d     Secondary diagnosis:  Principal Problem:   PROM (premature rupture of membranes) Active Problems:   Pregnancy  Additional problems: n/a    Discharge diagnosis: Term Pregnancy Delivered                                              Post partum procedures:none Augmentation: Pitocin  Complications: None  Hospital course: Onset of Labor With Vaginal Delivery      33 y.o. yo [id number] at [redacted]w[redacted]d was admitted in Latent Labor on 02/24/2024. Labor course was complicated by n/a  Membrane Rupture Time/Date: 8:30 AM,02/24/2024  Delivery Method:Vaginal, Spontaneous Operative Delivery:N/A Episiotomy: None Lacerations:  Vaginal Patient had a postpartum course complicated by  n/a.  She is ambulating, tolerating a regular diet, passing flatus, and urinating well. Patient is discharged home in stable condition on 02/25/24.  Newborn Data: Birth date:02/24/2024 Birth time:8:11 PM Gender:Female Living status:Living Apgars:8 ,9  Weight:3118 g  Magnesium Sulfate received: No BMZ received: No Rhophylac:No MMR:No T-DaP:Given prenatally Flu: No RSV Vaccine received: No Transfusion:No  Immunizations received: Immunization History  Administered Date(s) Administered   Influenza-Unspecified 02/17/2016   PFIZER(Purple Top)SARS-COV-2 Vaccination 08/18/2019, 09/09/2019    Physical exam  Vitals:   02/24/24 2100 02/24/24 2155 02/24/24 2259 02/25/24 0248  BP: 127/76 125/77 123/80 120/79  Pulse: 88 79 83 77  Resp:  18 18   Temp:  98.6 F (37 C) 97.9 F (36.6 C) 98.3 F (36.8 C)  TempSrc:  Oral Oral Oral  SpO2:  98%    Weight:      Height:       General:  alert, cooperative, and no distress Lochia: appropriate Uterine Fundus: firm Incision: Healing well with no significant drainage, No significant erythema DVT Evaluation: No evidence of DVT seen on physical exam. Negative Homan's sign. No cords or calf tenderness. Labs: Lab Results  Component Value Date   WBC 11.5 (H) 02/25/2024   HGB 10.2 (L) 02/25/2024   HCT 29.1 (L) 02/25/2024   MCV 92.1 02/25/2024   PLT 203 02/25/2024      Latest Ref Rng & Units 11/15/2015   11:50 AM  CMP  Glucose 65 - 99 mg/dL 85   BUN 6 - 20 mg/dL 8   Creatinine 9.55 - 8.99 mg/dL 9.43   Sodium 864 - 854 mmol/L 139   Potassium 3.5 - 5.1 mmol/L 3.9   Chloride 101 - 111 mmol/L 108   CO2 22 - 32 mmol/L 25   Calcium 8.9 - 10.3 mg/dL 9.0   Total Protein 6.5 - 8.1 g/dL 7.3   Total Bilirubin 0.3 - 1.2 mg/dL 0.8   Alkaline Phos 38 - 126 U/L 40   AST 15 - 41 U/L 16   ALT 14 - 54 U/L 13    Edinburgh Score:    10/19/2019    8:09 AM  Edinburgh Postnatal Depression Scale Screening Tool  I have been able to laugh and see the funny side of things. 0  I have looked forward with enjoyment to things. 0  I have blamed myself unnecessarily when things went wrong. 1  I have been anxious or worried for no good reason. 1  I have felt scared or panicky for no good reason. 1  Things have been getting on top of me. 1  I have been so unhappy that I have had difficulty sleeping. 0  I have felt sad or miserable. 0  I have been so unhappy that I have been crying. 0  The thought of harming myself has occurred to me. 0  Edinburgh Postnatal Depression Scale Total 4      Data saved with a previous flowsheet row definition   No data recorded  After visit meds:  Allergies as of 02/25/2024       Reactions   Shellfish Allergy Swelling   Okay to take all other shellfish, allergic to muscles- cause swelling of face, lips, chin        Medication List     STOP taking these medications    doxylamine (Sleep) 25 MG  tablet Commonly known as: UNISOM   famotidine 20 MG tablet Commonly known as: PEPCID   valACYclovir  500 MG tablet Commonly known as: VALTREX        TAKE these medications    acetaminophen  325 MG tablet Commonly known as: Tylenol  Take 2 tablets (650 mg total) by mouth every 6 (six) hours as needed (for pain scale < 4).   artificial tears ophthalmic solution Place 1 drop into both eyes as needed for dry eyes.   ibuprofen  600 MG tablet Commonly known as: ADVIL  Take 1 tablet (600 mg total) by mouth every 6 (six) hours. What changed:  when to take this reasons to take this   levocetirizine 5 MG tablet Commonly known as: XYZAL Take 5 mg by mouth every evening.   montelukast 10 MG tablet Commonly known as: SINGULAIR Take 10 mg by mouth at bedtime.   prenatal multivitamin Tabs tablet Take 1 tablet by mouth daily at 12 noon.   sertraline  50 MG tablet Commonly known as: ZOLOFT  Take 50 mg by mouth at bedtime.         Discharge home in stable condition Infant Feeding: Breast Infant Disposition:home with mother Discharge instruction: per After Visit Summary and Postpartum booklet. Activity: Advance as tolerated. Pelvic rest for 6 weeks.  Diet: routine diet Future Appointments:No future appointments. Follow up Visit:PPV in 6 weeks.  02/25/2024 Duwaine Blumenthal, DO

## 2024-02-25 NOTE — Anesthesia Postprocedure Evaluation (Signed)
 Anesthesia Post Note  Patient: Sandra Wilcox  Procedure(s) Performed: AN AD HOC LABOR EPIDURAL     Patient location during evaluation: Mother Baby Anesthesia Type: Epidural Level of consciousness: awake, awake and alert and oriented Pain management: satisfactory to patient Vital Signs Assessment: post-procedure vital signs reviewed and stable Respiratory status: spontaneous breathing, nonlabored ventilation and respiratory function stable Cardiovascular status: stable Postop Assessment: no headache, no backache, patient able to bend at knees, no apparent nausea or vomiting, adequate PO intake and able to ambulate Anesthetic complications: no   No notable events documented.  Last Vitals:  Vitals:   02/25/24 0248 02/25/24 0815  BP: 120/79 123/89  Pulse: 77 77  Resp:  18  Temp: 36.8 C 37.2 C  SpO2:  98%    Last Pain:  Vitals:   02/25/24 0830  TempSrc:   PainSc: 4    Pain Goal:                Epidural/Spinal Function Cutaneous sensation: Normal sensation (02/25/24 0815), Patient able to flex knees: Yes (02/25/24 0815), Patient able to lift hips off bed: Yes (02/25/24 0815), Back pain beyond tenderness at insertion site: No (02/25/24 0815), Progressively worsening motor and/or sensory loss: No (02/25/24 0815), Bowel and/or bladder incontinence post epidural: No (02/25/24 0815)  Krystalle Pilkington

## 2024-02-25 NOTE — Progress Notes (Signed)
 Post Partum Day 1 Subjective: no complaints, up ad lib, voiding, and tolerating PO.  Requesting d/c home today.  Objective: Blood pressure 120/79, pulse 77, temperature 98.3 F (36.8 C), temperature source Oral, resp. rate 18, height 5' 5 (1.651 m), weight 74.8 kg, last menstrual period 06/09/2023, SpO2 98%, unknown if currently breastfeeding.  Physical Exam:  General: alert, cooperative, and appears stated age 33: appropriate Uterine Fundus: firm Incision: healing well, no significant drainage, no dehiscence DVT Evaluation: No evidence of DVT seen on physical exam. Negative Homan's sign. No cords or calf tenderness.  Recent Labs    02/24/24 1250 02/25/24 0428  HGB 11.3* 10.2*  HCT 33.8* 29.1*    Assessment/Plan: Discharge home and Breastfeeding   LOS: 1 day   Duwaine Blumenthal, DO 02/25/2024, 8:15 AM

## 2024-02-26 NOTE — Lactation Note (Signed)
 This note was copied from a baby's chart. Lactation Consultation Note  Patient Name: Sandra Wilcox Date: 02/26/2024 Age:33 hours Reason for consult: Follow-up assessment;Early term 37-38.6wks  P2, Baby [redacted]w[redacted]d.  Baby is breastfeeding and being supplemented with formula. Mother's nipples are tender so she has supplemented. Discussed pumping to establish her supply. Reviewed engorgement care and monitoring voids/stools.  Maternal Data Has patient been taught Hand Expression?: Yes  Feeding Mother's Current Feeding Choice: Breast Milk and Formula Nipple Type: Slow - flow  Interventions Interventions: Education  Discharge Discharge Education: Engorgement and breast care;Warning signs for feeding baby Pump: Personal;DEBP;Hands Free  Consult Status Consult Status: Complete Date: 02/26/24  Shannon Levorn Lemme  RN, IBCLC 02/26/2024, 11:30 AM

## 2024-02-26 NOTE — Progress Notes (Signed)
 Post Partum Day 2 Subjective: no complaints, up ad lib, voiding, tolerating PO, and + flatus Ready for discharge. Peds did not want to discharge baby last night Objective: Blood pressure 120/78, pulse 67, temperature 98.5 F (36.9 C), temperature source Oral, resp. rate 18, height 5' 5 (1.651 m), weight 74.8 kg, last menstrual period 06/09/2023, SpO2 99%, unknown if currently breastfeeding.  Physical Exam:  General: alert, cooperative, and no distress Lochia: appropriate Uterine Fundus: firm Incision: healing well DVT Evaluation: No evidence of DVT seen on physical exam.  Recent Labs    02/24/24 1250 02/25/24 0428  HGB 11.3* 10.2*  HCT 33.8* 29.1*    Assessment/Plan: Discharge home D/C instructions reviewed  LOS: 2 days   Sandra Wilcox Curlene PONCE, MD 02/26/2024, 7:51 AM

## 2024-03-03 ENCOUNTER — Other Ambulatory Visit: Payer: Self-pay

## 2024-03-03 ENCOUNTER — Encounter (HOSPITAL_COMMUNITY): Payer: Self-pay

## 2024-03-03 ENCOUNTER — Emergency Department (HOSPITAL_COMMUNITY)
Admission: EM | Admit: 2024-03-03 | Discharge: 2024-03-04 | Disposition: A | Attending: Emergency Medicine | Admitting: Emergency Medicine

## 2024-03-03 DIAGNOSIS — R109 Unspecified abdominal pain: Secondary | ICD-10-CM | POA: Diagnosis present

## 2024-03-03 DIAGNOSIS — K8020 Calculus of gallbladder without cholecystitis without obstruction: Secondary | ICD-10-CM | POA: Insufficient documentation

## 2024-03-03 MED ORDER — MORPHINE SULFATE (PF) 4 MG/ML IV SOLN
4.0000 mg | Freq: Once | INTRAVENOUS | Status: AC
  Administered 2024-03-04: 4 mg via INTRAVENOUS
  Filled 2024-03-03: qty 1

## 2024-03-03 MED ORDER — ONDANSETRON HCL 4 MG/2ML IJ SOLN
4.0000 mg | Freq: Once | INTRAMUSCULAR | Status: AC
  Administered 2024-03-04: 4 mg via INTRAVENOUS
  Filled 2024-03-03: qty 2

## 2024-03-03 NOTE — ED Triage Notes (Signed)
 Coming from home with medic for abd pain that started 30 minutes ago, she had meatloaf for dinner. No nausea or dizziness at this time but the pain is in the epigastric region. She reports no marijuana or alcohol tonight. She is 1 week postpartum.   Medic vitals   114/88 78hr 97%ra 106bgl

## 2024-03-04 ENCOUNTER — Emergency Department (HOSPITAL_COMMUNITY)

## 2024-03-04 ENCOUNTER — Other Ambulatory Visit (HOSPITAL_COMMUNITY): Payer: Self-pay

## 2024-03-04 LAB — COMPREHENSIVE METABOLIC PANEL WITH GFR
ALT: 40 U/L (ref 0–44)
AST: 61 U/L — ABNORMAL HIGH (ref 15–41)
Albumin: 3.1 g/dL — ABNORMAL LOW (ref 3.5–5.0)
Alkaline Phosphatase: 89 U/L (ref 38–126)
Anion gap: 15 (ref 5–15)
BUN: 17 mg/dL (ref 6–20)
CO2: 21 mmol/L — ABNORMAL LOW (ref 22–32)
Calcium: 9 mg/dL (ref 8.9–10.3)
Chloride: 101 mmol/L (ref 98–111)
Creatinine, Ser: 0.78 mg/dL (ref 0.44–1.00)
GFR, Estimated: >60 mL/min (ref >60)
Glucose, Bld: 91 mg/dL (ref 70–99)
Potassium: 3.5 mmol/L (ref 3.5–5.1)
Sodium: 137 mmol/L (ref 135–145)
Total Bilirubin: 0.9 mg/dL (ref 0.0–1.2)
Total Protein: 6.5 g/dL (ref 6.5–8.1)

## 2024-03-04 LAB — CBC
HCT: 39.1 % (ref 36.0–46.0)
Hemoglobin: 13.2 g/dL (ref 12.0–15.0)
MCH: 31.1 pg (ref 26.0–34.0)
MCHC: 33.8 g/dL (ref 30.0–36.0)
MCV: 92.2 fL (ref 80.0–100.0)
Platelets: 304 K/uL (ref 150–400)
RBC: 4.24 MIL/uL (ref 3.87–5.11)
RDW: 13.2 % (ref 11.5–15.5)
WBC: 9.3 K/uL (ref 4.0–10.5)
nRBC: 0 % (ref 0.0–0.2)

## 2024-03-04 LAB — LIPASE, BLOOD: Lipase: 30 U/L (ref 11–51)

## 2024-03-04 MED ORDER — OXYCODONE HCL 5 MG PO TABS: 5.0000 mg | ORAL_TABLET | Freq: Four times a day (QID) | ORAL | 0 refills | Status: DC | PRN

## 2024-03-04 MED ORDER — OXYCODONE HCL 5 MG PO TABS
5.0000 mg | ORAL_TABLET | Freq: Four times a day (QID) | ORAL | 0 refills | Status: DC
  Filled 2024-03-04: qty 20, 5d supply, fill #0

## 2024-03-04 MED ORDER — ONDANSETRON 4 MG PO TBDP
4.0000 mg | ORAL_TABLET | Freq: Three times a day (TID) | ORAL | 0 refills | Status: DC | PRN
Start: 2024-03-04 — End: 2024-04-23

## 2024-03-04 MED ORDER — ONDANSETRON 4 MG PO TBDP
4.0000 mg | ORAL_TABLET | Freq: Three times a day (TID) | ORAL | 0 refills | Status: AC | PRN
  Filled 2024-03-04: qty 10, 4d supply, fill #0

## 2024-03-04 NOTE — Discharge Instructions (Addendum)
 Take the prescribed medication as directed.  If you do wish to resume breastfeeding, make sure to pump/dump for at least 24 hours after last dose. Recommend low fat diet, decrease processed foods. Follow-up with general surgery-- I would call this morning to get appt scheduled. Return to the ED for new or worsening symptoms.

## 2024-03-04 NOTE — ED Provider Notes (Signed)
 [place] EMERGENCY DEPARTMENT AT [facility] Provider Note   CSN: [id number] Arrival date & time: 03/03/24  2327     Patient presents with: Abdominal Pain   Sandra Wilcox is a 33 y.o. female.   The history is provided by the patient and medical records.  Abdominal Pain Associated symptoms: nausea and vomiting    33 year old female with history of seasonal allergies, HSV, ovarian cyst, presenting to the ED for abdominal pain.  States ate meatloaf for dinner around 7 PM, around 10 PM she was awoken from sleep with severe epigastric pain that was radiating to left and right sides.  Stated it is sharp and intense pressure.  She reports repeated episodes of nausea and vomiting.  No fever or chills.  She has 1 week postpartum from SVD without complications.  She is currently breast and bottlefeeding.  She does report history of similar during pregnancy and she was told it was likely gas and to take Gas-X.  She tried taking that today without change.  Prior to Admission medications   Medication Sig Start Date End Date Taking? Authorizing Provider  acetaminophen  (TYLENOL ) 325 MG tablet Take 2 tablets (650 mg total) by mouth every 6 (six) hours as needed (for pain scale < 4). 10/19/19   Curlene Agent, MD  ibuprofen  (ADVIL ) 600 MG tablet Take 1 tablet (600 mg total) by mouth every 6 (six) hours. 02/25/24   Morris, Megan, DO  levocetirizine (XYZAL) 5 MG tablet Take 5 mg by mouth every evening.    [provider]  montelukast (SINGULAIR) 10 MG tablet Take 10 mg by mouth at bedtime.    [provider]  polyvinyl alcohol (LIQUIFILM TEARS) 1.4 % ophthalmic solution Place 1 drop into both eyes as needed for dry eyes.    [provider]  Prenatal Vit-Fe Fumarate-FA (PRENATAL MULTIVITAMIN) TABS tablet Take 1 tablet by mouth daily at 12 noon.    [provider]  sertraline  (ZOLOFT ) 50 MG tablet Take 50 mg by mouth at bedtime.    [provider]   fluticasone  (FLONASE ) 50 MCG/ACT nasal spray Place 2 sprays into the nose daily. 10/11/12 03/11/14  Humberto Elspeth LABOR, MD    Allergies: Shellfish allergy    Review of Systems  Gastrointestinal:  Positive for abdominal pain, nausea and vomiting.  All other systems reviewed and are negative.   Updated Vital Signs BP (!) 147/84   Pulse 76   Temp 98.3 F (36.8 C) (Oral)   Resp 10   LMP 06/09/2023   SpO2 100%   Physical Exam Vitals and nursing note reviewed.  Constitutional:      Appearance: She is well-developed.  HENT:     Head: Normocephalic and atraumatic.  Eyes:     Conjunctiva/sclera: Conjunctivae normal.     Pupils: Pupils are equal, round, and reactive to light.  Cardiovascular:     Rate and Rhythm: Normal rate and regular rhythm.     Heart sounds: Normal heart sounds.  Pulmonary:     Effort: Pulmonary effort is normal.     Breath sounds: Normal breath sounds.  Abdominal:     General: Bowel sounds are normal.     Palpations: Abdomen is soft.     Tenderness: There is abdominal tenderness in the right upper quadrant, epigastric area and left upper quadrant.  Musculoskeletal:        General: Normal range of motion.     Cervical back: Normal range of motion.  Skin:  General: Skin is warm and dry.  Neurological:     Mental Status: She is alert and oriented to person, place, and time.     (all labs ordered are listed, but only abnormal results are displayed) Labs Reviewed  COMPREHENSIVE METABOLIC PANEL WITH GFR - Abnormal; Notable for the following components:      Result Value   CO2 21 (*)    Albumin 3.1 (*)    AST 61 (*)    All other components within normal limits  LIPASE, BLOOD  CBC  URINALYSIS, ROUTINE W REFLEX MICROSCOPIC    EKG: None  Radiology: US  Abdomen Limited RUQ (LIVER/GB) Result Date: 03/04/2024 CLINICAL DATA:  Epigastric pain EXAM: ULTRASOUND ABDOMEN LIMITED RIGHT UPPER QUADRANT COMPARISON:  11/15/2015 FINDINGS: Gallbladder: Layering  shadowing stones in the gallbladder. No wall thickening or sonographic Murphy sign. Small amount of sludge within the gallbladder. Common bile duct: Diameter: Normal caliber, 4 mm Liver: Increased echotexture compatible with fatty infiltration. No focal abnormality or biliary ductal dilatation. Portal vein is patent on color Doppler imaging with normal direction of blood flow towards the liver. Other: None. IMPRESSION: Layering stones and sludge within the gallbladder. No evidence of acute cholecystitis. Hepatic steatosis. Electronically Signed   By: Franky Crease M.D.   On: 03/04/2024 01:40     Procedures   Medications Ordered in the ED  morphine  (PF) 4 MG/ML injection 4 mg (4 mg Intravenous Given 03/04/24 0012)  ondansetron  (ZOFRAN ) injection 4 mg (4 mg Intravenous Given 03/04/24 0012)                                    Medical Decision Making Amount and/or Complexity of Data Reviewed Labs: ordered. Radiology: ordered and independent interpretation performed. ECG/medicine tests: ordered and independent interpretation performed.  Risk Prescription drug management.   33 year old female here with epigastric abdominal pain, began around 10 PM.  Ate meatloaf for dinner around 730.  She reports some issues like this during pregnancy but was told it was gas.  Did try taking some Gas-X today without relief.  Several episodes of vomiting.  She is afebrile and nontoxic in appearance here.  She does seem uncomfortable.  Tenderness epigastric area but seems to be radiating out and is tender in the right upper quadrant as well.  No Murphy sign.  Concern for possible gallstones.  Plan for labs and right upper quadrant ultrasound.  She was agreeable to pain medication here, advised to pump and dump.  Labs as above--no leukocytosis.  No significant electrolyte derangement.  AST is trivially elevated at 61 but normal alk phos and bilirubin.  Right upper quadrant ultrasound with stones and sludge in the  gallbladder.  No findings of cholecystitis.  Lipase delayed as [facility], called lab to verify this and unfortunately was overlooked-- they will run now.  5:19 AM Pain has been well-controlled here after single dose of meds.  Lipase is finally returned and is normal.  Patient is currently eating and drinking currently.  Remains hemodynamically stable.  We have discussed her imaging and lab findings today.  Feel she is stable for discharge.  She will need close follow-up with general surgery.  Has been mix of breast and bottlefeeding.  We discussed use of opiate pain medication and breast-feeding as she will need to pump and dump, states she plans to stop breast-feeding altogether as multiple issues have  arisen since then.  We did discuss pump/dump protocol should she want to resume.  Will refer to general surgery for follow-up.  Can return here for new concerns.  Final diagnoses:  Calculus of gallbladder without cholecystitis without obstruction    ED Discharge Orders          Ordered    oxyCODONE  (ROXICODONE ) 5 MG immediate release tablet  Every 6 hours PRN        03/04/24 0524    ondansetron  (ZOFRAN -ODT) 4 MG disintegrating tablet  Every 8 hours PRN        03/04/24 0524               Jarold Olam HERO, PA-C 03/04/24 0540    Jerral Meth, MD 03/04/24 [phone]

## 2024-03-09 ENCOUNTER — Telehealth (HOSPITAL_COMMUNITY): Payer: Self-pay

## 2024-03-09 NOTE — Telephone Encounter (Signed)
 03/09/2024 1222  Name: BRITAINY KOZUB MRN: [id number] DOB: 1990/07/22  Reason for Call:  Transition of Care Hospital Discharge Call  Contact Status: Patient Contact Status: Message  Language assistant needed:          Follow-Up Questions:    Van Postnatal Depression Scale:  In the Past 7 Days:    PHQ2-9 Depression Scale:     Discharge Follow-up:    Post-discharge interventions: NA  Signature  Rosaline Deretha PEAK

## 2024-04-04 ENCOUNTER — Ambulatory Visit: Payer: Self-pay | Admitting: General Surgery

## 2024-04-25 NOTE — Patient Instructions (Signed)
 SURGICAL WAITING ROOM VISITATION  Patients having surgery or a procedure may have no more than 2 support people in the waiting area - these visitors may rotate.    Children under the age of 69 must have an adult with them who is not the patient.  Visitors with respiratory illnesses are discouraged from visiting and should remain at home.  If the patient needs to stay at the hospital during part of their recovery, the visitor guidelines for inpatient rooms apply. Pre-op nurse will coordinate an appropriate time for 1 support person to accompany patient in pre-op.  This support person may not rotate.    Please refer to the [facility] website for the visitor guidelines for Inpatients (after your surgery is over and you are in a regular room).       Your procedure is scheduled on: 04/30/24   Report to [facility] Main Entrance    Report to admitting at 8 AM   Call this number if you have problems the morning of surgery [phone]   Do not eat food :After Midnight.   After Midnight you may have the following liquids until 7:15 AM DAY OF SURGERY  Water Non-Citrus Juices (without pulp, NO RED-Apple, White grape, White cranberry) Black Coffee (NO MILK/CREAM OR CREAMERS, sugar ok)  Clear Tea (NO MILK/CREAM OR CREAMERS, sugar ok) regular and decaf                             Plain Jell-O (NO RED)                                           Fruit ices (not with fruit pulp, NO RED)                                     Popsicles (NO RED)                                                               Sports drinks like Gatorade (NO RED)                   Oral Hygiene is also important to reduce your risk of infection.                                    Remember - BRUSH YOUR TEETH THE MORNING OF SURGERY WITH YOUR REGULAR TOOTHPASTE    Stop all vitamins and herbal supplements 7 days before surgery.   Take these medicines the morning of surgery with A SIP OF WATER: famotidine,  levocetirizine(xyzal), loestrin, oxycodone , sertraline , inhaler.             You may not have any metal on your body including hair pins, jewelry, and body piercing             Do not wear make-up, lotions, powders, perfumes/cologne, or deodorant  Do not wear nail polish including gel and S&S, artificial/acrylic nails, or any other type of  covering on natural nails including finger and toenails. If you have artificial nails, gel coating, etc. that needs to be removed by a nail salon please have this removed prior to surgery or surgery may need to be canceled/ delayed if the surgeon/ anesthesia feels like they are unable to be safely monitored.   Do not shave  48 hours prior to surgery.             Do not bring valuables to the hospital. [facility] IS NOT             RESPONSIBLE   FOR VALUABLES.   Contacts, glasses, dentures or bridgework may not be worn into surgery.    DO NOT BRING YOUR HOME MEDICATIONS TO THE HOSPITAL. PHARMACY WILL DISPENSE MEDICATIONS LISTED ON YOUR MEDICATION LIST TO YOU DURING YOUR ADMISSION IN THE HOSPITAL!    Patients discharged on the day of surgery will not be allowed to drive home.  Someone NEEDS to stay with you for the first 24 hours after anesthesia.   Special Instructions: Bring a copy of your healthcare power of attorney and living will documents the day of surgery if you haven't scanned them before.              Please read over the following fact sheets you were given: IF YOU HAVE QUESTIONS ABOUT YOUR PRE-OP INSTRUCTIONS PLEASE CALL [phone] Sandra Wilcox   If you received a COVID test during your pre-op visit  it is requested that you wear a mask when out in public, stay away from anyone that may not be feeling well and notify your surgeon if you develop symptoms. If you test positive for Covid or have been in contact with anyone that has tested positive in the last 10 days please notify you surgeon.     - Preparing for Surgery Before surgery,  you can play an important role.  Because skin is not sterile, your skin needs to be as free of germs as possible.  You can reduce the number of germs on your skin by washing with CHG (chlorahexidine gluconate) soap before surgery.  CHG is an antiseptic cleaner which kills germs and bonds with the skin to continue killing germs even after washing. Please DO NOT use if you have an allergy to CHG or antibacterial soaps.  If your skin becomes reddened/irritated stop using the CHG and inform your nurse when you arrive at Short Stay. Do not shave (including legs and underarms) for at least 48 hours prior to the first CHG shower.  You may shave your face/neck.  Please follow these instructions carefully:  1.  Shower with CHG Soap the night before surgery ONLY (DO NOT USE THE SOAP THE MORNING OF SURGERY).  2.  If you choose to wash your hair, wash your hair first as usual with your normal  shampoo.  3.  After you shampoo, rinse your hair and body thoroughly to remove the shampoo.                             4.  Use CHG as you would any other liquid soap.  You can apply chg directly to the skin and wash.  Gently with a scrungie or clean washcloth.  5.  Apply the CHG Soap to your body ONLY FROM THE NECK DOWN.   Do   not use on face/ open  Wound or open sores. Avoid contact with eyes, ears mouth and   genitals (private parts).                       Wash face,  Genitals (private parts) with your normal soap.             6.  Wash thoroughly, paying special attention to the area where your    surgery  will be performed.  7.  Thoroughly rinse your body with warm water from the neck down.  8.  DO NOT shower/wash with your normal soap after using and rinsing off the CHG Soap.                9.  Pat yourself dry with a clean towel.            10.  Wear clean pajamas.            11.  Place clean sheets on your bed the night of your first shower and do not  sleep with pets. Day of Surgery  : Do not apply any CHG, lotions/deodorants the morning of surgery.  Please wear clean clothes to the hospital/surgery center.  FAILURE TO FOLLOW THESE INSTRUCTIONS MAY RESULT IN THE CANCELLATION OF YOUR SURGERY  PATIENT SIGNATURE_________________________________  NURSE SIGNATURE__________________________________  ________________________________________________________________________

## 2024-04-25 NOTE — Progress Notes (Addendum)
 COVID Vaccine received:  []  No [x]  Yes Date of any COVID positive Test in last 90 days: no PCP - Tylene Meres PA-C Cardiologist - n/a  Chest x-ray -  EKG -  03/03/24 Epic Stress Test -  ECHO -  Cardiac Cath -   Bowel Prep - [x]  No  []   Yes ______  Pacemaker / ICD device [x]  No []  Yes   Spinal Cord Stimulator:[x]  No []  Yes       History of Sleep Apnea? [x]  No []  Yes   CPAP used?- [x]  No []  Yes    Does the patient monitor blood sugar?          [x]  No []  Yes  []  N/A  Patient has: [x]  NO Hx DM   []  Pre-DM                 []  DM1  []   DM2 Does patient have a [employer] or Dexacom? []  No []  Yes   Fasting Blood Sugar Ranges-  Checks Blood Sugar _____ times a day  GLP1 agonist / usual dose - no GLP1 instructions:  SGLT-2 inhibitors / usual dose - no SGLT-2 instructions:   Blood Thinner / Instructions:no Aspirin Instructions:no  Comments:   Activity level: Patient is able  to climb a flight of stairs without difficulty; [x]  No CP  [x]  No SOB,    Patient can /perform ADLs without assistance.   Anesthesia review:   Patient denies shortness of breath, fever, cough and chest pain at PAT appointment.  Patient verbalized understanding and agreement to the Pre-Surgical Instructions that were given to them at this PAT appointment. Patient was also educated of the need to review these PAT instructions again prior to his/her surgery.I reviewed the appropriate phone numbers to call if they have any and questions or concerns.

## 2024-04-26 ENCOUNTER — Encounter (HOSPITAL_COMMUNITY)
Admission: RE | Admit: 2024-04-26 | Discharge: 2024-04-26 | Disposition: A | Discharge status: Home / Self Care | Source: Ambulatory Visit | Attending: General Surgery | Admitting: General Surgery

## 2024-04-26 ENCOUNTER — Encounter (HOSPITAL_COMMUNITY): Payer: Self-pay | Admitting: *Deleted

## 2024-04-26 ENCOUNTER — Other Ambulatory Visit: Payer: Self-pay

## 2024-04-26 VITALS — BP 123/87 | HR 77 | Temp 98.7°F | Resp 16 | Ht 64.0 in | Wt 150.0 lb

## 2024-04-26 DIAGNOSIS — Z01818 Encounter for other preprocedural examination: Secondary | ICD-10-CM

## 2024-04-26 DIAGNOSIS — Z01812 Encounter for preprocedural laboratory examination: Secondary | ICD-10-CM | POA: Diagnosis not present

## 2024-04-26 HISTORY — DX: Gastro-esophageal reflux disease without esophagitis: K21.9

## 2024-04-26 HISTORY — DX: Headache, unspecified: R51.9

## 2024-04-26 LAB — CBC
HCT: 40 % (ref 36.0–46.0)
Hemoglobin: 13.2 g/dL (ref 12.0–15.0)
MCH: 31 pg (ref 26.0–34.0)
MCHC: 33 g/dL (ref 30.0–36.0)
MCV: 93.9 fL (ref 80.0–100.0)
Platelets: 339 K/uL (ref 150–400)
RBC: 4.26 MIL/uL (ref 3.87–5.11)
RDW: 13.4 % (ref 11.5–15.5)
WBC: 4.8 K/uL (ref 4.0–10.5)
nRBC: 0 % (ref 0.0–0.2)

## 2024-04-29 NOTE — Anesthesia Preprocedure Evaluation (Signed)
 Anesthesia Evaluation  Patient identified by MRN, date of birth, ID band Patient awake    Reviewed: Allergy & Precautions, H&P , NPO status , Patient's Chart, lab work & pertinent test results  Airway Mallampati: II  TM Distance: >3 FB Neck ROM: Full    Dental no notable dental hx. (+) Teeth Intact, Dental Advisory Given   Pulmonary neg pulmonary ROS   Pulmonary exam normal breath sounds clear to auscultation       Cardiovascular Exercise Tolerance: Good negative cardio ROS  Rhythm:Regular Rate:Normal     Neuro/Psych  Headaches  Anxiety        GI/Hepatic Neg liver ROS,GERD  Medicated,,  Endo/Other  negative endocrine ROS    Renal/GU negative Renal ROS  negative genitourinary   Musculoskeletal   Abdominal   Peds  Hematology negative hematology ROS (+)   Anesthesia Other Findings   Reproductive/Obstetrics negative OB ROS                              Anesthesia Physical Anesthesia Plan  ASA: 2  Anesthesia Plan: General   Post-op Pain Management: Tylenol  PO (pre-op)*   Induction: Intravenous  PONV Risk Score and Plan: 4 or greater and Ondansetron , Dexamethasone  and Midazolam   Airway Management Planned: Oral ETT  Additional Equipment:   Intra-op Plan:   Post-operative Plan: Extubation in OR  Informed Consent: I have reviewed the patients History and Physical, chart, labs and discussed the procedure including the risks, benefits and alternatives for the proposed anesthesia with the patient or authorized representative who has indicated his/her understanding and acceptance.     Dental advisory given  Plan Discussed with: CRNA  Anesthesia Plan Comments:          Anesthesia Quick Evaluation

## 2024-04-30 ENCOUNTER — Ambulatory Visit (HOSPITAL_COMMUNITY): Payer: Self-pay | Admitting: Medical

## 2024-04-30 ENCOUNTER — Ambulatory Visit (HOSPITAL_COMMUNITY): Payer: Self-pay | Admitting: Anesthesiology

## 2024-04-30 ENCOUNTER — Other Ambulatory Visit (HOSPITAL_COMMUNITY): Payer: Self-pay

## 2024-04-30 ENCOUNTER — Ambulatory Visit (HOSPITAL_COMMUNITY)
Admission: RE | Admit: 2024-04-30 | Discharge: 2024-04-30 | Disposition: A | Discharge status: Home / Self Care | Attending: General Surgery | Admitting: General Surgery

## 2024-04-30 ENCOUNTER — Encounter (HOSPITAL_COMMUNITY): Payer: Self-pay | Admitting: General Surgery

## 2024-04-30 ENCOUNTER — Encounter (HOSPITAL_COMMUNITY)
Admission: RE | Disposition: A | Discharge status: Home / Self Care | Payer: Self-pay | Source: Home / Self Care | Attending: General Surgery

## 2024-04-30 DIAGNOSIS — K219 Gastro-esophageal reflux disease without esophagitis: Secondary | ICD-10-CM | POA: Insufficient documentation

## 2024-04-30 DIAGNOSIS — F419 Anxiety disorder, unspecified: Secondary | ICD-10-CM | POA: Insufficient documentation

## 2024-04-30 DIAGNOSIS — Z79899 Other long term (current) drug therapy: Secondary | ICD-10-CM | POA: Diagnosis not present

## 2024-04-30 DIAGNOSIS — K8010 Calculus of gallbladder with chronic cholecystitis without obstruction: Secondary | ICD-10-CM | POA: Insufficient documentation

## 2024-04-30 DIAGNOSIS — K8020 Calculus of gallbladder without cholecystitis without obstruction: Secondary | ICD-10-CM | POA: Diagnosis not present

## 2024-04-30 HISTORY — PX: CHOLECYSTECTOMY: SHX55

## 2024-04-30 LAB — POCT PREGNANCY, URINE: Preg Test, Ur: NEGATIVE

## 2024-04-30 SURGERY — LAPAROSCOPIC CHOLECYSTECTOMY
Anesthesia: General

## 2024-04-30 MED ORDER — SUGAMMADEX SODIUM 200 MG/2ML IV SOLN
INTRAVENOUS | Status: AC
  Filled 2024-04-30: qty 2

## 2024-04-30 MED ORDER — MIDAZOLAM HCL 2 MG/2ML IJ SOLN
INTRAMUSCULAR | Status: AC
  Filled 2024-04-30: qty 2

## 2024-04-30 MED ORDER — ROCURONIUM BROMIDE 10 MG/ML (PF) SYRINGE
PREFILLED_SYRINGE | INTRAVENOUS | Status: AC
  Filled 2024-04-30: qty 10

## 2024-04-30 MED ORDER — DEXMEDETOMIDINE HCL IN NACL 80 MCG/20ML IV SOLN
INTRAVENOUS | Status: AC
  Filled 2024-04-30: qty 20

## 2024-04-30 MED ORDER — LIDOCAINE HCL (CARDIAC) PF 100 MG/5ML IV SOSY
PREFILLED_SYRINGE | INTRAVENOUS | Status: DC | PRN
  Administered 2024-04-30: 60 mg via INTRAVENOUS

## 2024-04-30 MED ORDER — DEXAMETHASONE SOD PHOSPHATE PF 10 MG/ML IJ SOLN
INTRAMUSCULAR | Status: DC | PRN
  Administered 2024-04-30: 5 mg via INTRAVENOUS

## 2024-04-30 MED ORDER — SCOPOLAMINE 1 MG/3DAYS TD PT72: 1.0000 | MEDICATED_PATCH | TRANSDERMAL | Status: DC

## 2024-04-30 MED ORDER — FENTANYL CITRATE (PF) 100 MCG/2ML IJ SOLN
INTRAMUSCULAR | Status: AC
  Filled 2024-04-30: qty 2

## 2024-04-30 MED ORDER — MIDAZOLAM HCL 5 MG/5ML IJ SOLN
INTRAMUSCULAR | Status: DC | PRN
  Administered 2024-04-30: 2 mg via INTRAVENOUS

## 2024-04-30 MED ORDER — LACTATED RINGERS IR SOLN
Status: DC | PRN
  Administered 2024-04-30: 1000 mL

## 2024-04-30 MED ORDER — LIDOCAINE HCL (PF) 2 % IJ SOLN
INTRAMUSCULAR | Status: AC
Start: 2024-04-30 — End: 2024-04-30
  Filled 2024-04-30: qty 5

## 2024-04-30 MED ORDER — CHLORHEXIDINE GLUCONATE 0.12 % MT SOLN: 15.0000 mL | Freq: Once | OROMUCOSAL | Status: DC

## 2024-04-30 MED ORDER — ACETAMINOPHEN 500 MG PO TABS
1000.0000 mg | ORAL_TABLET | Freq: Once | ORAL | Status: AC
  Administered 2024-04-30: 1000 mg via ORAL
  Filled 2024-04-30: qty 2

## 2024-04-30 MED ORDER — INDOCYANINE GREEN 25 MG IV SOLR
2.5000 mg | Freq: Once | INTRAVENOUS | Status: AC
  Administered 2024-04-30: 2.5 mg via INTRAVENOUS
  Filled 2024-04-30: qty 10

## 2024-04-30 MED ORDER — EPHEDRINE 5 MG/ML INJ
INTRAVENOUS | Status: AC
Start: 2024-04-30 — End: 2024-04-30
  Filled 2024-04-30: qty 5

## 2024-04-30 MED ORDER — ONDANSETRON HCL 4 MG/2ML IJ SOLN
INTRAMUSCULAR | Status: AC
  Filled 2024-04-30: qty 2

## 2024-04-30 MED ORDER — EPHEDRINE SULFATE (PRESSORS) 25 MG/5ML IV SOSY
PREFILLED_SYRINGE | INTRAVENOUS | Status: DC | PRN
  Administered 2024-04-30: 10 mg via INTRAVENOUS

## 2024-04-30 MED ORDER — HYDROMORPHONE HCL 1 MG/ML IJ SOLN
0.2500 mg | INTRAMUSCULAR | Status: DC | PRN
  Administered 2024-04-30 (×2): 0.5 mg via INTRAVENOUS

## 2024-04-30 MED ORDER — LIDOCAINE HCL (PF) 2 % IJ SOLN
INTRAMUSCULAR | Status: AC
  Filled 2024-04-30: qty 5

## 2024-04-30 MED ORDER — PROPOFOL 10 MG/ML IV BOLUS
INTRAVENOUS | Status: AC
  Filled 2024-04-30: qty 20

## 2024-04-30 MED ORDER — LACTATED RINGERS IV SOLN: INTRAVENOUS | Status: DC

## 2024-04-30 MED ORDER — FENTANYL CITRATE (PF) 100 MCG/2ML IJ SOLN
INTRAMUSCULAR | Status: DC | PRN
  Administered 2024-04-30 (×4): 50 ug via INTRAVENOUS

## 2024-04-30 MED ORDER — PROPOFOL 10 MG/ML IV BOLUS
INTRAVENOUS | Status: DC | PRN
  Administered 2024-04-30: 150 mg via INTRAVENOUS

## 2024-04-30 MED ORDER — CELECOXIB 200 MG PO CAPS
200.0000 mg | ORAL_CAPSULE | ORAL | Status: AC
  Administered 2024-04-30: 200 mg via ORAL
  Filled 2024-04-30: qty 1

## 2024-04-30 MED ORDER — FENTANYL CITRATE (PF) 250 MCG/5ML IJ SOLN
INTRAMUSCULAR | Status: AC
  Filled 2024-04-30: qty 5

## 2024-04-30 MED ORDER — SCOPOLAMINE 1 MG/3DAYS TD PT72
MEDICATED_PATCH | TRANSDERMAL | Status: AC
  Administered 2024-04-30: 1 mg via TRANSDERMAL
  Filled 2024-04-30: qty 1

## 2024-04-30 MED ORDER — CHLORHEXIDINE GLUCONATE CLOTH 2 % EX PADS: 6.0000 | MEDICATED_PAD | Freq: Once | CUTANEOUS | Status: DC

## 2024-04-30 MED ORDER — ACETAMINOPHEN 500 MG PO TABS: 1000.0000 mg | ORAL_TABLET | ORAL | Status: DC

## 2024-04-30 MED ORDER — HYDROMORPHONE HCL 1 MG/ML IJ SOLN
INTRAMUSCULAR | Status: AC
  Filled 2024-04-30: qty 1

## 2024-04-30 MED ORDER — 0.9 % SODIUM CHLORIDE (POUR BTL) OPTIME
TOPICAL | Status: DC | PRN
  Administered 2024-04-30: 1000 mL

## 2024-04-30 MED ORDER — CEFAZOLIN SODIUM-DEXTROSE 2-4 GM/100ML-% IV SOLN
2.0000 g | INTRAVENOUS | Status: AC
  Administered 2024-04-30: 2 g via INTRAVENOUS
  Filled 2024-04-30: qty 100

## 2024-04-30 MED ORDER — ACETAMINOPHEN 325 MG PO TABS
650.0000 mg | ORAL_TABLET | Freq: Four times a day (QID) | ORAL | 0 refills | Status: AC
  Filled 2024-04-30: qty 50, 6d supply, fill #0

## 2024-04-30 MED ORDER — ROCURONIUM BROMIDE 100 MG/10ML IV SOLN
INTRAVENOUS | Status: DC | PRN
  Administered 2024-04-30: 50 mg via INTRAVENOUS

## 2024-04-30 MED ORDER — GABAPENTIN 300 MG PO CAPS
300.0000 mg | ORAL_CAPSULE | ORAL | Status: AC
  Administered 2024-04-30: 300 mg via ORAL
  Filled 2024-04-30: qty 3

## 2024-04-30 MED ORDER — BUPIVACAINE-EPINEPHRINE 0.25% -1:200000 IJ SOLN
INTRAMUSCULAR | Status: DC | PRN
  Administered 2024-04-30: 30 mL

## 2024-04-30 MED ORDER — BUPIVACAINE-EPINEPHRINE (PF) 0.25% -1:200000 IJ SOLN
INTRAMUSCULAR | Status: AC
  Filled 2024-04-30: qty 30

## 2024-04-30 MED ORDER — OXYCODONE HCL 5 MG PO TABS
5.0000 mg | ORAL_TABLET | Freq: Three times a day (TID) | ORAL | 0 refills | Status: AC | PRN
  Filled 2024-04-30: qty 12, 4d supply, fill #0

## 2024-04-30 MED ORDER — SUGAMMADEX SODIUM 200 MG/2ML IV SOLN
INTRAVENOUS | Status: DC | PRN
  Administered 2024-04-30: 150 mg via INTRAVENOUS

## 2024-04-30 MED ORDER — ONDANSETRON HCL 4 MG/2ML IJ SOLN
INTRAMUSCULAR | Status: DC | PRN
  Administered 2024-04-30: 4 mg via INTRAVENOUS

## 2024-04-30 MED ORDER — IBUPROFEN 200 MG PO TABS
600.0000 mg | ORAL_TABLET | Freq: Four times a day (QID) | ORAL | 0 refills | Status: AC
  Filled 2024-04-30: qty 75, 6d supply, fill #0

## 2024-04-30 MED ORDER — ORAL CARE MOUTH RINSE: 15.0000 mL | Freq: Once | OROMUCOSAL | Status: DC

## 2024-04-30 SURGICAL SUPPLY — 32 items
CATH CHOLANG 76X19 KUMAR (CATHETERS) IMPLANT
CHLORAPREP W/TINT 26 (MISCELLANEOUS) ×1 IMPLANT
CLIP APPLIE 5 13 M/L LIGAMAX5 (MISCELLANEOUS) IMPLANT
CLIP APPLIE ROT 10 11.4 M/L (STAPLE) IMPLANT
COVER MAYO STAND XLG (MISCELLANEOUS) IMPLANT
COVER SURGICAL LIGHT HANDLE (MISCELLANEOUS) ×1 IMPLANT
DERMABOND ADVANCED .7 DNX12 (GAUZE/BANDAGES/DRESSINGS) ×1 IMPLANT
DRAPE C-ARM 42X120 X-RAY (DRAPES) IMPLANT
ELECT PENCIL ROCKER SW 15FT (MISCELLANEOUS) ×1 IMPLANT
ELECT REM PT RETURN 15FT ADLT (MISCELLANEOUS) ×1 IMPLANT
ENDOLOOP SUT PDS II 0 18 (SUTURE) ×1 IMPLANT
GLOVE BIO SURGEON STRL SZ7 (GLOVE) ×1 IMPLANT
GOWN STRL REUS W/ TWL XL LVL3 (GOWN DISPOSABLE) ×1 IMPLANT
GRASPER SUT TROCAR 14GX15 (MISCELLANEOUS) IMPLANT
HEMOSTAT SNOW SURGICEL 2X4 (HEMOSTASIS) IMPLANT
IRRIGATION SUCT STRKRFLW 2 WTP (MISCELLANEOUS) IMPLANT
KIT BASIN OR (CUSTOM PROCEDURE TRAY) ×1 IMPLANT
KIT IMAGING PINPOINTPAQ (MISCELLANEOUS) IMPLANT
KIT TURNOVER KIT A (KITS) ×1 IMPLANT
LHOOK LAP DISP 36CM (ELECTROSURGICAL) ×1 IMPLANT
NDL INSUFFLATION 14GA 120MM (NEEDLE) ×1 IMPLANT
POUCH RETRIEVAL ECOSAC 10 (ENDOMECHANICALS) ×1 IMPLANT
SCISSORS LAP 5X35 DISP (ENDOMECHANICALS) ×1 IMPLANT
SET TUBE SMOKE EVAC HIGH FLOW (TUBING) ×1 IMPLANT
SLEEVE Z-THREAD 5X100MM (TROCAR) ×2 IMPLANT
SPIKE FLUID TRANSFER (MISCELLANEOUS) ×1 IMPLANT
SUT MNCRL AB 4-0 PS2 18 (SUTURE) ×1 IMPLANT
SUT VICRYL 0 UR6 27IN ABS (SUTURE) ×1 IMPLANT
TOWEL OR DSP ST BLU DLX 10/PK (DISPOSABLE) ×1 IMPLANT
TRAY LAPAROSCOPIC (CUSTOM PROCEDURE TRAY) ×1 IMPLANT
TROCAR ADV FIXATION 12X100MM (TROCAR) ×1 IMPLANT
TROCAR Z-THREAD OPTICAL 5X100M (TROCAR) ×1 IMPLANT

## 2024-04-30 NOTE — Transfer of Care (Signed)
 Immediate Anesthesia Transfer of Care Note  Patient: Sandra Wilcox  Procedure(s) Performed: LAPAROSCOPIC CHOLECYSTECTOMY WITH ICG DYE  Patient Location: PACU  Anesthesia Type:General  Level of Consciousness: awake, alert , and oriented  Airway & Oxygen Therapy: Patient Spontanous Breathing and Patient connected to face mask oxygen  Post-op Assessment: Report given to RN and Post -op Vital signs reviewed and stable  Post vital signs: Reviewed and stable  Last Vitals:  Vitals Value Taken Time  BP 125/94 04/30/24 12:45  Temp 97.3   Pulse 87 04/30/24 12:46  Resp 13 04/30/24 12:46  SpO2 100 % 04/30/24 12:46  Vitals shown include unfiled device data.  Last Pain:  Vitals:   04/30/24 0834  TempSrc:   PainSc: 0-No pain         Complications: No notable events documented.

## 2024-04-30 NOTE — Op Note (Signed)
 Patient: Sandra Wilcox MRN: [id number] DOB: 03-19-1991 Sex: female Operation/Procedure Date: 04/30/2024 Surgeons and Role:    * Sandra Wilcox, Sandra LABOR, MD - Primary Pre-operative Diagnoses: SYMPTOMATIC CHOLELITHIASIS Postoperative Diagnoses: SYMPTOMATIC CHOLELITHIASIS  Procedure performed: Procedure:    LAPAROSCOPIC CHOLECYSTECTOMY WITH ICG DYE CPT(R) Code:  52437 - PR LAPAROSCOPY SURG CHOLECYSTECTOMY  Anesthesia: General endotracheal anesthesia  Indications: Sandra Wilcox is a 33 year old female who was evaluated in the office and found to have symptomatic biliary disease. I offered her a cholecystectomy. Preoperatively, I discussed in detail the risks, benefits, alternatives, and potential complications. The patient understands and requests to proceed.  Operative Findings: Critical view obtained prior to placing clips.   Operative Narrative: The patient was positively identified and was taken to the operating room and placed supine on the operating table. A time-out was performed confirming correct patient and procedure. We also confirmed initiation of deep venous thrombosis prophylaxis and wound prophylaxis. After successful induction of general endotracheal anesthesia, the arms were carefully padded. An orogastric tube and footboard were placed. The abdomen was prepped and draped in the usual sterile surgical fashion.  We began our peritoneal access with a veress needle inserted at Palmer's point.  After aspiration showed return of air bubbles and there was a positive saline drop test, the insufflation was connected and the abdomen brought to a pressure of .  We then used an opti-view technique to place a 5mm port just to the right of midline, superior to the umbilicus.  A laparoscope was introduced into the abdomen, and there were no signs of injury from entry.  A 12mm port was placed in the subxiphoid position.  Two additional 5mm ports were placed in the RUQ.  A 360-degree  visualization with a 30-degree 5-mm laparoscope revealed grossly normal intra-abdominal contents.   The patient was placed in the head up position and tilted slightly to the left. The dome of the gallbladder was then grasped, elevated, and retracted anteriorly and cephalad.  The infundibulum was retracted laterally and inferiorly exposing Calot's triangle. The investing visceral peritoneal attachments overlying the infundibulum of the gallbladder were incised using the hook electrocautery and dissected free from the gallbladder itself. We soon developed two structures into the gallbladder consistent with the cystic duct and cystic artery. The loose areolar tissue around these structures was dissected free. The gallbladder was separated from the gallbladder fossa for approximately a third the distance up from the cystic plate, establishing the critical view of safety. We then transitioned to infrared viewing mode to allow for visualization of the ICG tracer. There was tracer throughout the liver. There was tracer within the candidate cystic duct as well as in a separate structure medially to the duct, consistent with where the common bile duct would be expected.  There was no tracer within the candidate cystic artery.  There was tracer within the duodenum, indicating no presence of a biliary obstruction. The cystic duct and cystic artery were triply clipped. These were divided using laparoscopic scissors leaving a single clip on the removal side. The gallbladder was elevated off the gallbladder fossa using the hook Bovie. The gallbladder was then exteriorized through the subxiphoid port site using an EndoCatch bag. We reestablished pneumoperitoneum and confirmed no leakage of blood or bile. We confirmed integrity of our clips. The subhepatic space was irrigated with warm sterile saline and suctioned free. The 12mm port site was closed using an 0-vicryl on a suture passer.We placed 0.25% Marcaine  with epinephrine  at  each  incision site for local anesthesia. The skin was closed using 4-0 Monocryl subcuticular suture. Dermabond was applied. The patient tolerated the procedure well, was extubated, and taken to the recovery room.  Estimated Blood Loss: Minimal Specimens: Gallbladder Implants: None Drains: None Complications: None Condition of the patient: Good, extubated Disposition: PACU  Sandra Wilcox Date: 04/30/2024 Time: 12:25 PM

## 2024-04-30 NOTE — Discharge Instructions (Signed)
 Home Care After Gallbladder Removal  Activity  Limit activity for the first 24 hours, then you may return to normal daily activities. Returning to normal daily activities as soon as you can following surgery will enhance recovery time.  No heavy lifting pushing or pulling, anything heavier than 10 pounds (gallon of milk weighs approx. 8.8 pounds) for 2 weeks from surgery date.  Do not mow the lawn, use a vacuum cleaner, or do any other strenuous activities without first consulting your surgical team.  Climb stairs slowly and watch your step.  Walk as often as you feel able to increase strength and endurance.  No driving or operating heavy machinery within 24 hours of taking narcotic pain medication.  Diet  Drink plenty of fluids and eat a light meal on the night of surgery. Some patients may find their appetite is poor for a week or two after surgery. This is a normal result of the stress of surgery-your appetite will return in time.   There are no specific diet restrictions after surgery and you resume regular diet as tolerated. However, you may want to refrain from fatty, heavy, and/or greasy foods and follow a low fat diet for 2-4 weeks to avoid temporary diarrhea and nausea. Slowly add fats back into diet.   If you have diarrhea, try avoiding spicy foods, dairy products, fatty foods, and alcohol. You can also watch to see if specific foods cause it, and stop eating them. If the diarrhea continues for more than 2 weeks, talk to your doctor.  Dressings and Wound Care  Keep your wound or incision site clean and dry.  You have skin glue over your incisions. This will usually remain in place for 10-14 days, then naturally fall off your skin. You may take a shower 24 hrs after surgery, carefully wash, not scrub the incision site with a mild non-scented soap. Pat dry with a soft towel.  Do not pick or peel skin glue off.   You can shower and let the water fall on the dressings above. Do not soak or  submerge your incision(s) in a bath tub, hot tub, or swimming pool, until your doctor says it is ok to do so or the incision(s) have completely healed, usually about 2-4 weeks.  Do not use creams, powder, salves or balms on your incision(s).  What to Expect After Surgery  Moderate discomfort controlled with medications  You may feel pain in one or both shoulders. This pain comes from the gas still left in your belly after the surgery, if you had laparoscopic surgery (several small incisions). The pain should ease over several days to a week. Ambulation will help with this pain.   Minimal drainage from incision  Belly swelling  Feeling fatigue and weak  Loose stools after eating. This may occur for 4-8 weeks; however longer in some cases.   Constipation after surgery is common. Drink plenty fluids and eat a high fiber diet.   Pain Control: Prescribed Non-Narcotic Pain Medication  You will be given three prescriptions.  Two of them will be for prescription strength ibuprofen  (i.e. Advil ) and prescription strength acetaminophen  (i.e. Tylenol ).  The vast majority of patients will just need these two medications.  One prescription will be for a 'rescue' prescription of an oral narcotic (oxycodone ).  You may fill this if needed.  You will alternate taking the ibuprofen  (600mg ) every 6 hours and also the acetaminophen  (650mg ) every 6 hours so that you are taking one of those medications  every 3 hours.  For example: o 0800 - take ibuprofen  600mg  o 1100 - take acetaminophen  650mg  o 1400 - take ibuprofen  600mg  o 1700 - take acetaminophen  650mg  o Etc.  Continue taking this alternating pattern of ibuprofen  and acetaminophen  for 3 days  If you cannot take one or the other of these medications, just take the one you can every 6 hours.  If you are comfortable at night, you don't have to wake up and take a medication.  If you are still uncomfortable after taking either ibuprofen  or acetaminophen , try gentle  stretching exercise and ice packs (a bag of frozen vegetables works great).  If you are still uncomfortable, you may fill the narcotic prescription of Oxycodone  and take as directed.  Once you have completed these prescriptions, your pain level should be low enough to stop taking medications altogether or just use an over the counter medication (ibuprofen  or acetaminophen ) as needed.   Pain Control: Over the Counter Medications to take as needed  Colace/Docusate: May be prescribed by your surgeon to prevent constipation caused by the combination of narcotics, effects of anesthesia, and decreased ambulation.  Hold for loose stools or diarrhea. Take 100 mg 1-2 times a day starting tonight.   Fiber: High fiber foods, extra liquids (water 9-13 cups/day) can also assist with constipation. Examples of high fiber foods are fruit, bran. Prune juice and water are also good liquids to drink.  Milk of Magnesia/Miralax:  If constipated despite take the over the counter stool softeners, you may take Milk of Magnesia or Miralax as directed on bottle to assist with constipation.     Pepcid/Famotidine: May be prescribed while taking naproxen (Aleve) or other NSAIDs such as ibuprofen  (Motrin /Advil ) to prevent stomach upset or Acid-reflux symptoms. Take 1 tablet 1-2 times a day.   **Constipation: The first bowel movement may occur anywhere between 1-5 days after surgery.  As long as you are not nauseated or not having significant abdominal pain this variation is acceptable. Narcotic pain medications can cause constipation increasing discomfort; early discontinuation will assist with bowel management.If constipated despite taking stool softeners,  you may take Milk of Magnesia or Miralax as directed on the bottle.     **Home medications: You may restart your home medications as directed by your respective Primary Care Physician or Surgeon.   When to notify your Doctor or Healthcare Team  Sign of Wound  Infection   Fever over 100 degrees.  Wound becomes extremely swollen, shows red streaks, warm to the touch, and/or drainage from the incision site or foul-smelling drainage.  Wound edges separate or opens up  Bleeding or bruising   If you have bleeding, apply pressure to the site and hold the pressure firmly for 5 minutes. If the bleeding continues, apply pressure again and call 911. If the bleeding stopped, call your doctor to report it.   Call your doctor or nurse if you have increased bleeding from your site and increased bruising or a lump forms or gets larger under your skin at the site.  Unrelieved Pain    Call your doctor or nurse if your pain gets worse or is not eased 1 hour after taking your pain medicine, or if it is severe and uncontrolled.  Fever, Flu-like symptoms   Fever over 100 degrees and/or chills  Gastrointestinal Symptoms    If you have yellowing of your eyes or skin (jaundice)  If you have dark or rust-colored urine  If your stool is gray colored  If you have nausea and vomiting that continues more than 24 hours, will not let you keep medicine down and will not let you keep fluids down  Black tarry bowel movements. This can be normal after surgery on the stomach, but should resolve in a day or two.    Call 911 if you suddenly have signs of blood loss such as:  Vomiting blood  Fast heart rate  Feeling faint, sweaty, or blacking out  Passing bright red blood from your rectum  Blood Clot Symptoms   Tender, swollen or reddened areas in your calf muscle or thighs.  Numbness or tingling in your lower leg or calf, or at the top of your leg or groin  Skin on your leg looks pale or blue or feels cold to touch  Chest pain or have trouble breathing, lightheadedness, fast heart rate  Sudden Onset of Symptoms    Call 911 if you suddenly have:  Leg weakness and spasm  Loss of bladder or bowel function  Seizure  Confusion, severe headache, dizziness or feeling unsteady,  problems talking, difficulty swallowing, and/or numbness or muscle weakness as these could be signs of a stroke.   Follow up Appointment Your follow up appointment should be scheduled 2-3 weeks after your surgery date.  If you have not previously scheduled for a follow-up visit you can be scheduled by contacting [phone]

## 2024-04-30 NOTE — H&P (Signed)
 Sandra Wilcox April 02, 1991  [id number].    HPI:  33 y/o F with symptomatic biliary disease who presents for elective cholecystectomy. She reports that she is in her usual state of health and denies any recent changes in medication.   ROS: Review of Systems  Constitutional: Negative.   HENT: Negative.    Eyes: Negative.   Respiratory: Negative.    Cardiovascular: Negative.   Gastrointestinal: Negative.   Genitourinary: Negative.   Musculoskeletal: Negative.   Skin: Negative.   Neurological: Negative.   Endo/Heme/Allergies: Negative.   Psychiatric/Behavioral: Negative.      Family History  Problem Relation Age of Onset   Cancer Mother        skin- melanoma   Cancer Maternal Grandfather        skin    Past Medical History:  Diagnosis Date   Allergy    GERD (gastroesophageal reflux disease)    Headache    HSV (herpes simplex virus) anogenital infection    Ovarian cyst    Postpartum anxiety 2021    Past Surgical History:  Procedure Laterality Date   WISDOM TOOTH EXTRACTION      Social History:  reports that she has never smoked. She has never used smokeless tobacco. She reports that she does not currently use alcohol after a past usage of about 6.0 standard drinks of alcohol per week. She reports that she does not use drugs.  Allergies:  Allergies  Allergen Reactions   Shellfish Allergy Swelling    Okay to take all other shellfish, allergic to muscles- cause swelling of face, lips, chin    Medications Prior to Admission  Medication Sig Dispense Refill   albuterol (VENTOLIN HFA) 108 (90 Base) MCG/ACT inhaler Inhale 1-2 puffs into the lungs every 4 (four) hours as needed for wheezing or shortness of breath.     EPINEPHrine  0.3 mg/0.3 mL IJ SOAJ injection Inject 0.3 mg into the muscle as needed for anaphylaxis.     famotidine (PEPCID) 20 MG tablet Take 20 mg by mouth daily.     levocetirizine (XYZAL) 5 MG tablet Take 5 mg by mouth every evening.     LO  LOESTRIN FE 1 MG-10 MCG / 10 MCG tablet Take 1 tablet by mouth daily.     Magnesium 250 MG TABS Take 250 mg by mouth daily.     ondansetron  (ZOFRAN -ODT) 4 MG disintegrating tablet Take 1 tablet (4 mg total) by mouth every 8 (eight) hours as needed. 10 tablet 0   oxyCODONE  (OXY IR/ROXICODONE ) 5 MG immediate release tablet Take 1 tablet (5 mg total) by mouth every 6 (six) hours as needed for breakthrough pain. 20 tablet 0   Prenatal Vit-Fe Fumarate-FA (PRENATAL MULTIVITAMIN) TABS tablet Take 1 tablet by mouth daily at 12 noon.     sertraline  (ZOLOFT ) 50 MG tablet Take 50 mg by mouth at bedtime.     ibuprofen  (ADVIL ) 600 MG tablet Take 1 tablet (600 mg total) by mouth every 6 (six) hours. (Patient not taking: Reported on 04/23/2024) 30 tablet 0    Physical Exam: Blood pressure (!) 131/92, pulse 77, temperature 98.7 F (37.1 C), temperature source Oral, resp. rate 15, height 5' 4 (1.626 m), weight 68 kg, last menstrual period 04/15/2024, SpO2 96%, not currently breastfeeding. Gen: female, NAD Abd: soft, non-distended  Results for orders placed or performed during the hospital encounter of 04/30/24 (from the past 48 hours)  Pregnancy, urine POC     Status: None   Collection  Time: 04/30/24  8:20 AM  Result Value Ref Range   Preg Test, Ur NEGATIVE NEGATIVE    Comment:        THE SENSITIVITY OF THIS METHODOLOGY IS >20 mIU/mL.    No results found.  Assessment/Plan 33 y/o F with symptomatic cholelithiasis   - Will proceed to the OR. We discussed the alternatives and potential risks of surgery, including but not limited to: bleeding, infection, damage to bowel or surrounding structures, bile leak, pancreatitis, retained stone, damage to the biliary system, and need for additional procedures. All questions were addressed and consent was obtained.    Sandra Wilcox Surgery 04/30/2024, 10:52 AM Please see Amion for pager number during day hours 7:00am-4:30pm or 7:00am  -11:30am on weekends

## 2024-04-30 NOTE — Anesthesia Procedure Notes (Addendum)
 Procedure Name: Intubation Date/Time: 04/30/2024 11:39 AM  Performed by: Gladis Honey, CRNAPre-anesthesia Checklist: Patient identified, Emergency Drugs available, Suction available and Patient being monitored Patient Re-evaluated:Patient Re-evaluated prior to induction Oxygen Delivery Method: Circle System Utilized Preoxygenation: Pre-oxygenation with 100% oxygen Induction Type: IV induction Ventilation: Mask ventilation without difficulty Laryngoscope Size: Miller and 2 Grade View: Grade I Tube type: Oral Tube size: 7.0 mm Number of attempts: 1 Airway Equipment and Method: Stylet and Oral airway Placement Confirmation: ETT inserted through vocal cords under direct vision, positive ETCO2 and breath sounds checked- equal and bilateral Secured at: 21 cm Tube secured with: Tape Dental Injury: Teeth and Oropharynx as per pre-operative assessment

## 2024-05-01 ENCOUNTER — Encounter (HOSPITAL_COMMUNITY): Payer: Self-pay | Admitting: General Surgery

## 2024-05-01 LAB — SURGICAL PATHOLOGY

## 2024-05-06 NOTE — Anesthesia Postprocedure Evaluation (Signed)
 Anesthesia Post Note  Patient: Sandra Wilcox  Procedure(s) Performed: LAPAROSCOPIC CHOLECYSTECTOMY WITH ICG DYE     Patient location during evaluation: PACU Anesthesia Type: General Level of consciousness: awake and alert Pain management: pain level controlled Vital Signs Assessment: post-procedure vital signs reviewed and stable Respiratory status: spontaneous breathing, nonlabored ventilation, respiratory function stable and patient connected to nasal cannula oxygen Cardiovascular status: blood pressure returned to baseline and stable Postop Assessment: no apparent nausea or vomiting Anesthetic complications: no   No notable events documented.  Last Vitals:  Vitals:   04/30/24 1415 04/30/24 1430  BP: 129/82 127/86  Pulse: 80 85  Resp: 11 11  Temp:    SpO2: 95% 97%    Last Pain:  Vitals:   04/30/24 1430  TempSrc:   PainSc: 3                  Gregg Holster S
